# Patient Record
Sex: Male | Born: 1970 | Race: White | Hispanic: No | Marital: Single | Smoking: Never smoker
Health system: Southern US, Community
[De-identification: ages and names within clinical notes are randomized; demographics above are authoritative.]

## PROBLEM LIST (undated history)

## (undated) DIAGNOSIS — I10 Essential (primary) hypertension: Secondary | ICD-10-CM

## (undated) DIAGNOSIS — M1990 Unspecified osteoarthritis, unspecified site: Secondary | ICD-10-CM

## (undated) DIAGNOSIS — E119 Type 2 diabetes mellitus without complications: Secondary | ICD-10-CM

## (undated) DIAGNOSIS — M519 Unspecified thoracic, thoracolumbar and lumbosacral intervertebral disc disorder: Secondary | ICD-10-CM

## (undated) DIAGNOSIS — E785 Hyperlipidemia, unspecified: Secondary | ICD-10-CM

## (undated) HISTORY — DX: Type 2 diabetes mellitus without complications: E11.9

## (undated) HISTORY — PX: HERNIA REPAIR: SHX51

## (undated) HISTORY — DX: Unspecified thoracic, thoracolumbar and lumbosacral intervertebral disc disorder: M51.9

## (undated) HISTORY — DX: Essential (primary) hypertension: I10

## (undated) HISTORY — DX: Hyperlipidemia, unspecified: E78.5

---

## 2000-02-04 ENCOUNTER — Encounter: Admission: RE | Admit: 2000-02-04 | Discharge: 2000-04-07 | Payer: Self-pay | Admitting: Neurosurgery

## 2000-04-30 ENCOUNTER — Encounter: Admission: RE | Admit: 2000-04-30 | Discharge: 2000-07-29 | Payer: Self-pay | Admitting: Neurosurgery

## 2001-01-02 ENCOUNTER — Emergency Department (HOSPITAL_COMMUNITY): Admission: EM | Admit: 2001-01-02 | Discharge: 2001-01-02 | Payer: Self-pay | Admitting: Emergency Medicine

## 2001-09-19 ENCOUNTER — Emergency Department (HOSPITAL_COMMUNITY): Admission: EM | Admit: 2001-09-19 | Discharge: 2001-09-19 | Payer: Self-pay | Admitting: Emergency Medicine

## 2002-02-02 ENCOUNTER — Emergency Department (HOSPITAL_COMMUNITY): Admission: EM | Admit: 2002-02-02 | Discharge: 2002-02-02 | Payer: Self-pay | Admitting: Emergency Medicine

## 2002-02-20 ENCOUNTER — Emergency Department (HOSPITAL_COMMUNITY): Admission: EM | Admit: 2002-02-20 | Discharge: 2002-02-20 | Payer: Self-pay | Admitting: Emergency Medicine

## 2002-03-06 ENCOUNTER — Emergency Department (HOSPITAL_COMMUNITY): Admission: EM | Admit: 2002-03-06 | Discharge: 2002-03-06 | Payer: Self-pay | Admitting: Emergency Medicine

## 2002-04-12 ENCOUNTER — Emergency Department (HOSPITAL_COMMUNITY): Admission: EM | Admit: 2002-04-12 | Discharge: 2002-04-12 | Payer: Self-pay | Admitting: Emergency Medicine

## 2002-07-07 ENCOUNTER — Emergency Department (HOSPITAL_COMMUNITY): Admission: EM | Admit: 2002-07-07 | Discharge: 2002-07-07 | Payer: Self-pay | Admitting: Emergency Medicine

## 2002-07-31 ENCOUNTER — Emergency Department (HOSPITAL_COMMUNITY): Admission: EM | Admit: 2002-07-31 | Discharge: 2002-07-31 | Payer: Self-pay

## 2003-06-30 ENCOUNTER — Emergency Department (HOSPITAL_COMMUNITY): Admission: EM | Admit: 2003-06-30 | Discharge: 2003-06-30 | Payer: Self-pay | Admitting: Emergency Medicine

## 2004-10-14 ENCOUNTER — Emergency Department (HOSPITAL_COMMUNITY): Admission: EM | Admit: 2004-10-14 | Discharge: 2004-10-14 | Payer: Self-pay | Admitting: Emergency Medicine

## 2004-11-15 ENCOUNTER — Emergency Department (HOSPITAL_COMMUNITY): Admission: EM | Admit: 2004-11-15 | Discharge: 2004-11-15 | Payer: Self-pay | Admitting: Emergency Medicine

## 2005-01-19 ENCOUNTER — Emergency Department (HOSPITAL_COMMUNITY): Admission: EM | Admit: 2005-01-19 | Discharge: 2005-01-19 | Payer: Self-pay | Admitting: Family Medicine

## 2005-11-11 ENCOUNTER — Emergency Department (HOSPITAL_COMMUNITY): Admission: EM | Admit: 2005-11-11 | Discharge: 2005-11-11 | Payer: Self-pay | Admitting: Emergency Medicine

## 2008-01-08 ENCOUNTER — Emergency Department (HOSPITAL_COMMUNITY): Admission: EM | Admit: 2008-01-08 | Discharge: 2008-01-08 | Payer: Self-pay | Admitting: Emergency Medicine

## 2008-02-07 ENCOUNTER — Emergency Department (HOSPITAL_COMMUNITY): Admission: EM | Admit: 2008-02-07 | Discharge: 2008-02-07 | Payer: Self-pay | Admitting: *Deleted

## 2008-07-30 ENCOUNTER — Emergency Department (HOSPITAL_COMMUNITY): Admission: EM | Admit: 2008-07-30 | Discharge: 2008-07-30 | Payer: Self-pay | Admitting: Emergency Medicine

## 2009-12-25 ENCOUNTER — Emergency Department (HOSPITAL_BASED_OUTPATIENT_CLINIC_OR_DEPARTMENT_OTHER): Admission: EM | Admit: 2009-12-25 | Discharge: 2009-12-25 | Payer: Self-pay | Admitting: Emergency Medicine

## 2010-08-21 ENCOUNTER — Encounter: Payer: Self-pay | Admitting: *Deleted

## 2010-08-21 DIAGNOSIS — E119 Type 2 diabetes mellitus without complications: Secondary | ICD-10-CM | POA: Insufficient documentation

## 2010-08-21 DIAGNOSIS — E785 Hyperlipidemia, unspecified: Secondary | ICD-10-CM

## 2010-08-21 DIAGNOSIS — I10 Essential (primary) hypertension: Secondary | ICD-10-CM

## 2010-11-07 IMAGING — CR DG SHOULDER 2+V*R*
4 series · 4 of 4 positions shown · non-contrast
Comparison: 10/14/2004

CLINICAL DATA: Pain.  Throwing injury.

RIGHT SHOULDER - 2+ VIEW

[w shoulder ap external right (1 of 2)]
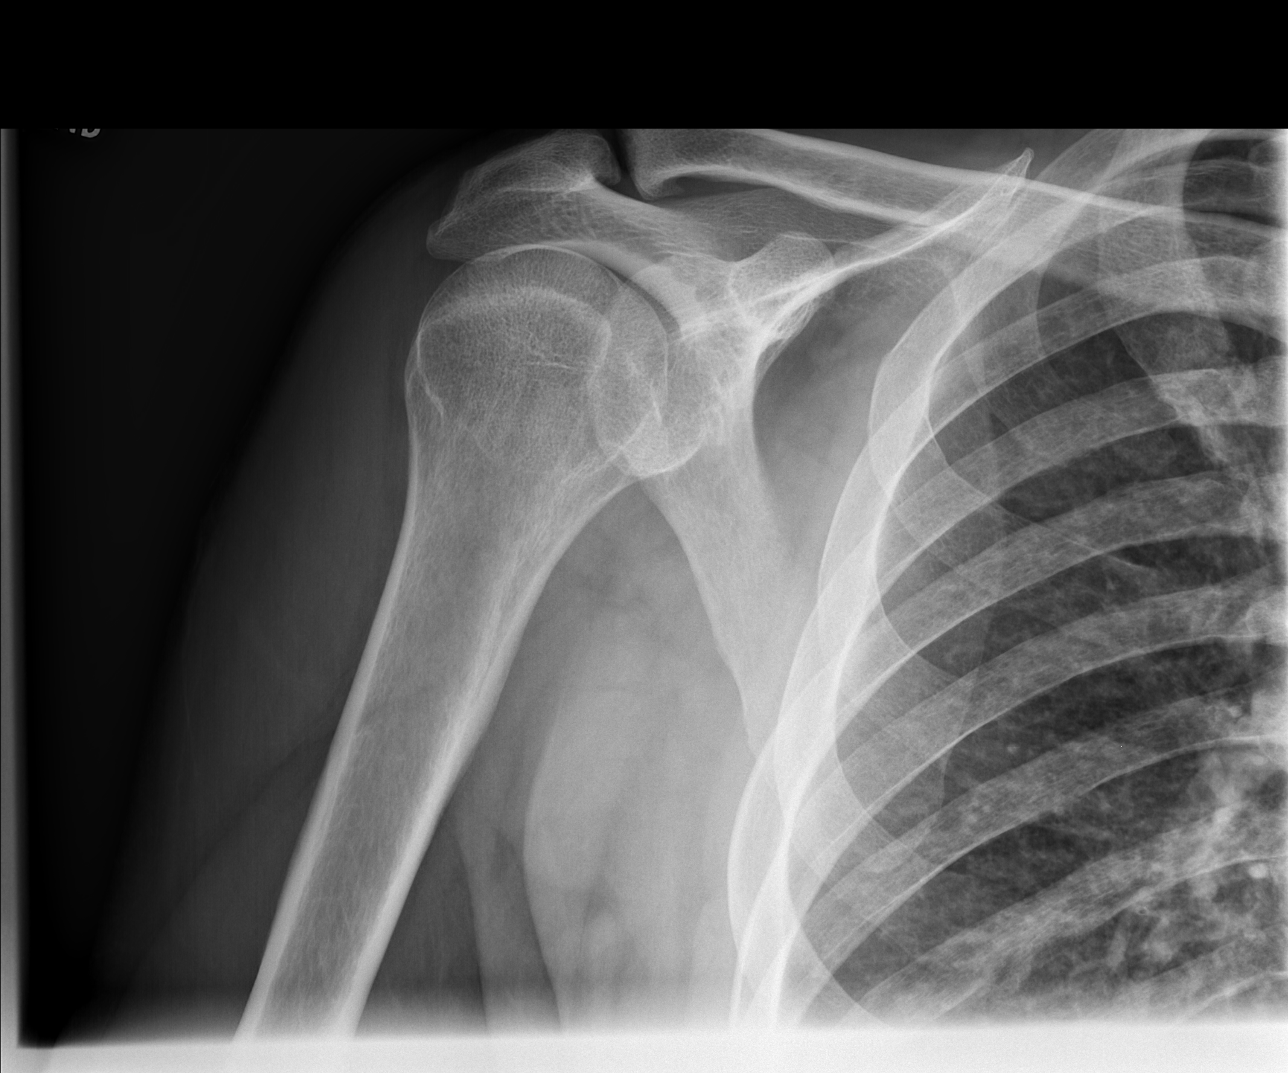

[w shoulder ap internal right]
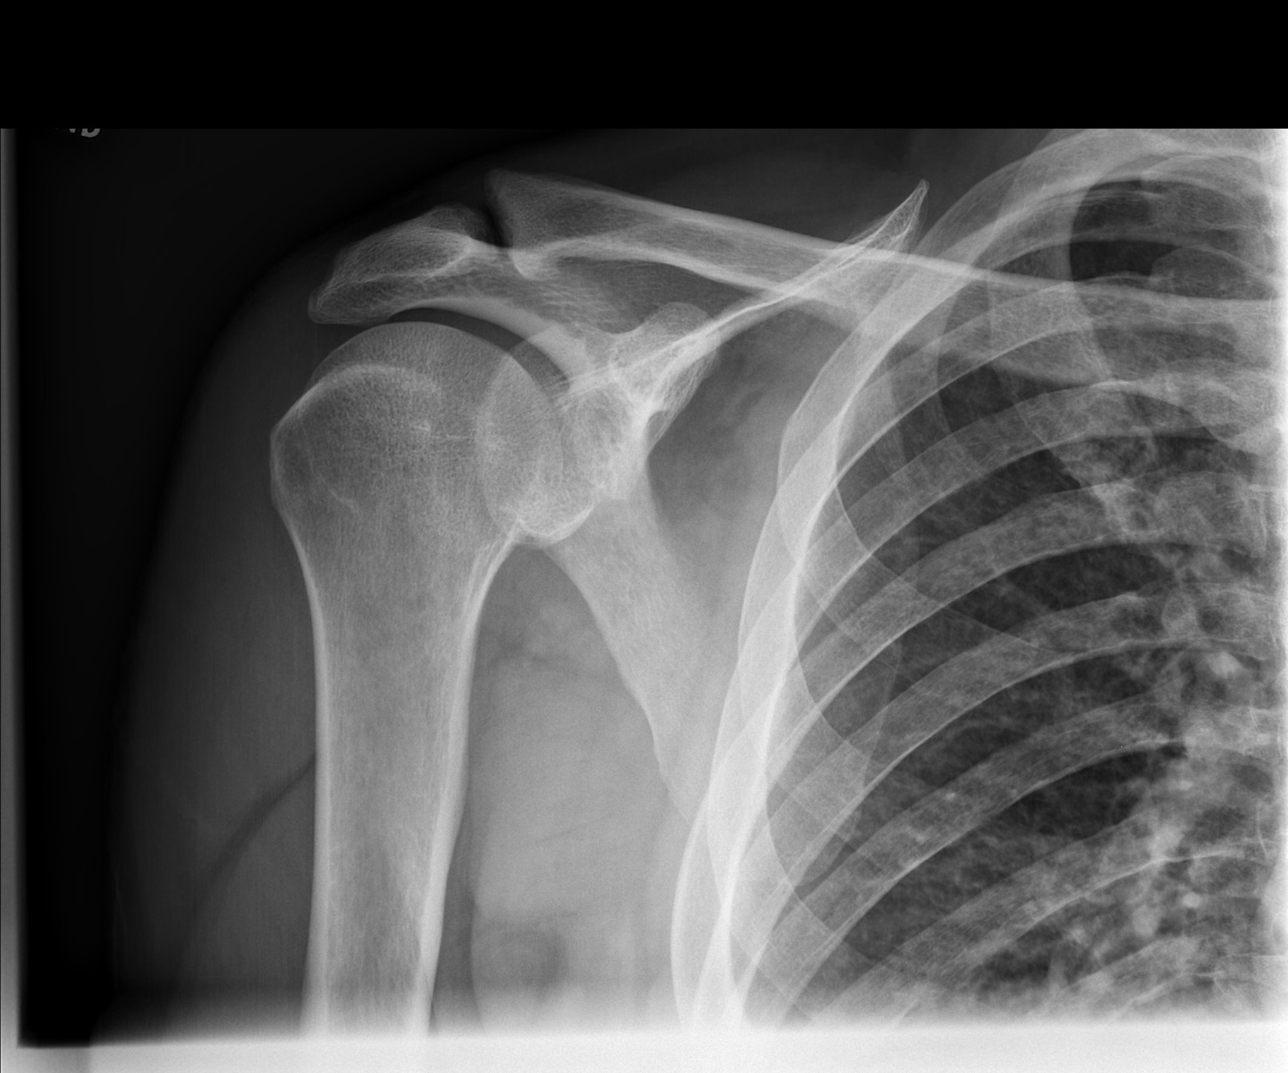

[w shoulder ap external right (2 of 2)]
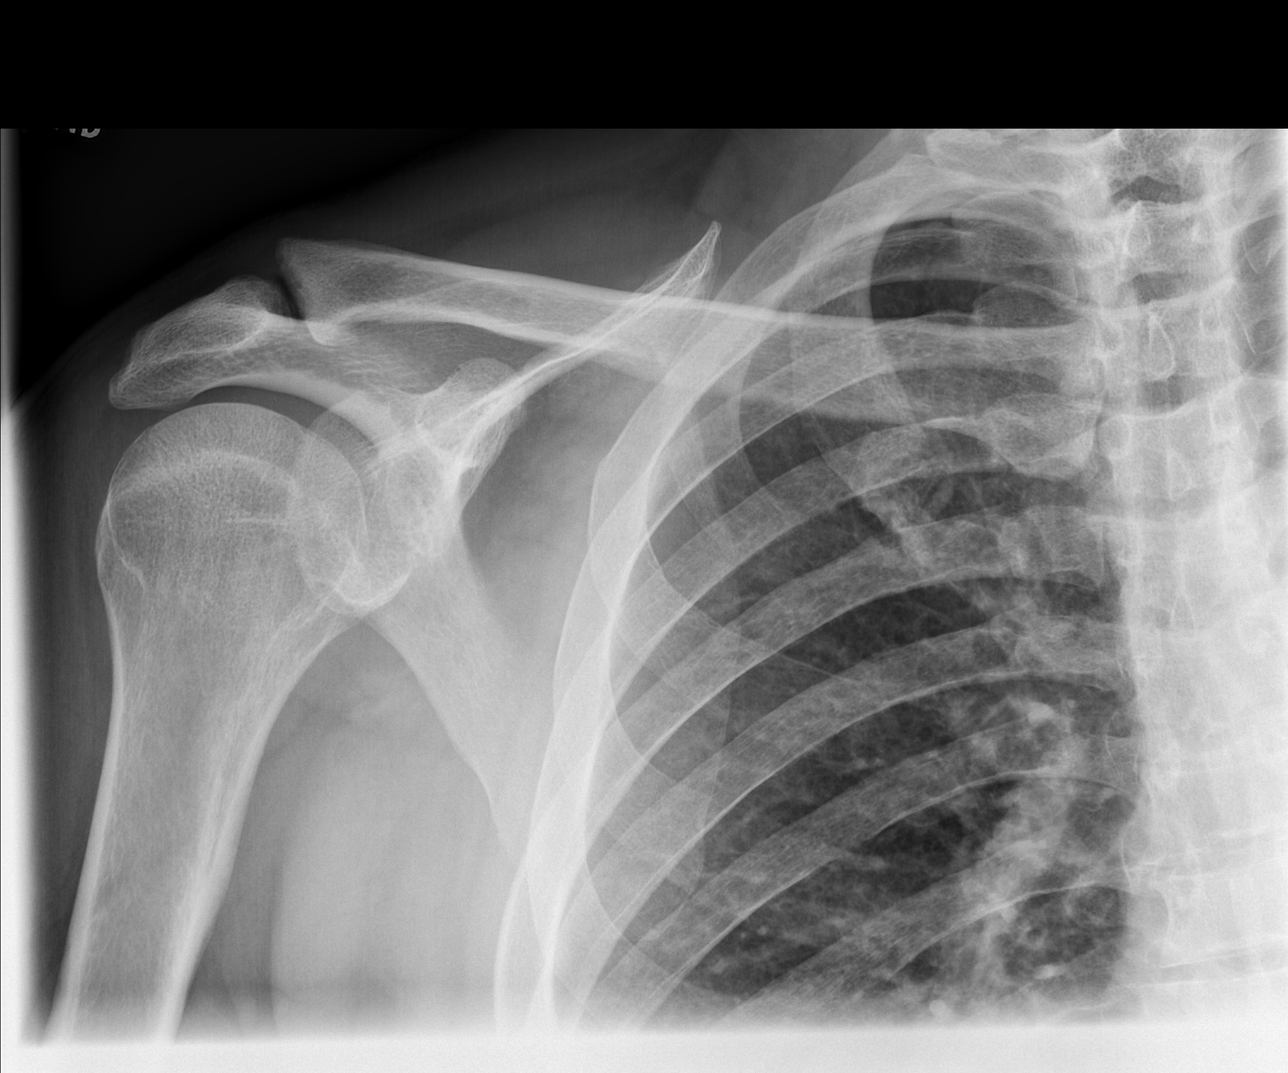

[w shoulder y view right *]
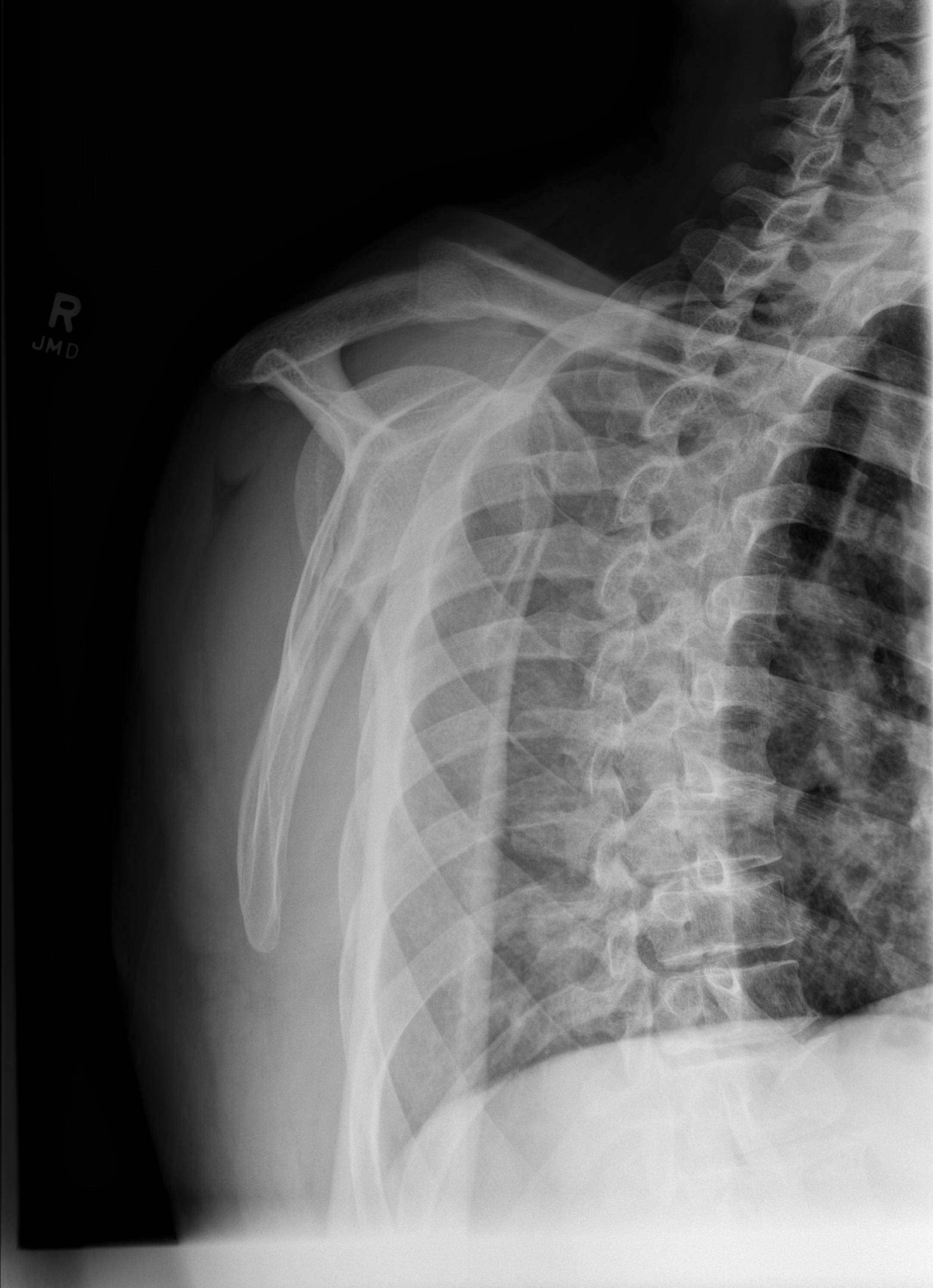

[4 of 4 positions shown; findings below may reference images not displayed]

FINDINGS: The patient achieves good internal and external rotation.
The glenohumeral joint is unremarkable.  There is a normal distance
between the humeral head and the acromial arch.  The acromion is
laterally pointed.  There is AC joint degenerative disease with
inferiorly projecting osteophytes.  The AC joint could be painful
in and of itself and could also contribute to impingement.
IMPRESSION: No glenohumeral joint pathology seen.  Unfavorable acromial
anatomy.  AC joint degenerative change which could be painful and
could contribute to impingement.

## 2012-07-30 ENCOUNTER — Telehealth: Payer: Self-pay | Admitting: Physician Assistant

## 2012-07-31 ENCOUNTER — Ambulatory Visit: Payer: Self-pay | Admitting: Physician Assistant

## 2012-07-31 ENCOUNTER — Telehealth: Payer: Self-pay | Admitting: Physician Assistant

## 2012-07-31 NOTE — Telephone Encounter (Signed)
appt made

## 2012-07-31 NOTE — Telephone Encounter (Signed)
Pt to call back and sch appt

## 2012-08-03 ENCOUNTER — Encounter: Payer: Self-pay | Admitting: Physician Assistant

## 2012-08-03 ENCOUNTER — Ambulatory Visit (INDEPENDENT_AMBULATORY_CARE_PROVIDER_SITE_OTHER): Payer: BC Managed Care – PPO | Admitting: Physician Assistant

## 2012-08-03 VITALS — BP 120/73 | HR 86 | Temp 98.9°F | Ht 70.0 in | Wt 225.6 lb

## 2012-08-03 DIAGNOSIS — E119 Type 2 diabetes mellitus without complications: Secondary | ICD-10-CM

## 2012-08-03 DIAGNOSIS — E785 Hyperlipidemia, unspecified: Secondary | ICD-10-CM

## 2012-08-03 DIAGNOSIS — I10 Essential (primary) hypertension: Secondary | ICD-10-CM

## 2012-08-03 LAB — COMPREHENSIVE METABOLIC PANEL
ALT: 20 U/L (ref 0–53)
AST: 16 U/L (ref 0–37)
Albumin: 4.6 g/dL (ref 3.5–5.2)
Alkaline Phosphatase: 48 U/L (ref 39–117)
Calcium: 9.7 mg/dL (ref 8.4–10.5)
Chloride: 102 mEq/L (ref 96–112)
Potassium: 4 mEq/L (ref 3.5–5.3)

## 2012-08-03 LAB — POCT CBC
Granulocyte percent: 58.5 %G (ref 37–80)
MPV: 6.8 fL (ref 0–99.8)
POC Granulocyte: 4.6 (ref 2–6.9)
Platelet Count, POC: 293 10*3/uL (ref 142–424)
RBC: 4.7 M/uL (ref 4.69–6.13)
RDW, POC: 12.8 %

## 2012-08-03 LAB — POCT GLYCOSYLATED HEMOGLOBIN (HGB A1C): Hemoglobin A1C: 5.8

## 2012-08-03 MED ORDER — ROSUVASTATIN CALCIUM 10 MG PO TABS: 10.0000 mg | ORAL_TABLET | Freq: Every day | ORAL | Status: DC

## 2012-08-03 MED ORDER — AMLODIPINE BESYLATE 10 MG PO TABS: 10.0000 mg | ORAL_TABLET | Freq: Every day | ORAL | Status: DC

## 2012-08-03 MED ORDER — NEBIVOLOL HCL 5 MG PO TABS: 5.0000 mg | ORAL_TABLET | Freq: Every day | ORAL | Status: DC

## 2012-08-03 MED ORDER — METFORMIN HCL 1000 MG PO TABS: 1000.0000 mg | ORAL_TABLET | Freq: Two times a day (BID) | ORAL | Status: DC

## 2012-08-03 MED ORDER — LISINOPRIL 10 MG PO TABS: 10.0000 mg | ORAL_TABLET | Freq: Every day | ORAL | Status: DC

## 2012-08-03 NOTE — Progress Notes (Signed)
  Subjective:    Patient ID: Corey Cruz, male    DOB: 05-Feb-1971, 42 y.o.   MRN: [id number]  HPI Regularly scheduled follow up on diabetes, HTN, hyperlipidemia; recently lost job, lost insurance, in process of planning for what he will need to do for his health care for the next several months   Review of Systems  All other systems reviewed and are negative.       Objective:   Physical Exam  Constitutional: He is oriented to person, place, and time. He appears well-developed and well-nourished.  HENT:  Head: Normocephalic and atraumatic.  Right Ear: External ear normal.  Left Ear: External ear normal.  Nose: Nose normal.  Mouth/Throat: Oropharynx is clear and moist.  Eyes: Conjunctivae and EOM are normal. Pupils are equal, round, and reactive to light.  Neck: Normal range of motion. Neck supple.  Cardiovascular: Normal rate, regular rhythm and normal heart sounds.   Pulmonary/Chest: Effort normal and breath sounds normal.  Abdominal: Soft. Bowel sounds are normal.  Musculoskeletal: Normal range of motion.  Neurological: He is alert and oriented to person, place, and time.  Skin: Skin is warm and dry.  Psychiatric: He has a normal mood and affect. His behavior is normal. Judgment and thought content normal.          Assessment & Plan:  HTN (hypertension) - Plan: amLODipine (NORVASC) 10 MG tablet, lisinopril (PRINIVIL,ZESTRIL) 10 MG tablet, nebivolol (BYSTOLIC) 5 MG tablet, POCT CBC, Comprehensive metabolic panel  Diabetes - Plan: metFORMIN (GLUCOPHAGE) 1000 MG tablet, Microalbumin, urine, POCT glycosylated hemoglobin (Hb A1C)  Other and unspecified hyperlipidemia - Plan: rosuvastatin (CRESTOR) 10 MG tablet, NMR Lipoprofile with Lipids

## 2012-08-04 LAB — NMR LIPOPROFILE WITH LIPIDS
HDL Particle Number: 28.3 umol/L — ABNORMAL LOW (ref >=30.5)
HDL Size: 8.8 nm — ABNORMAL LOW (ref >=9.2)
Large HDL-P: 3.4 umol/L — ABNORMAL LOW (ref >=4.8)
Large VLDL-P: 3.5 nmol/L — ABNORMAL HIGH (ref <=2.7)
Triglycerides: 291 mg/dL — ABNORMAL HIGH (ref <150)

## 2012-08-10 ENCOUNTER — Telehealth: Payer: Self-pay | Admitting: *Deleted

## 2012-08-10 NOTE — Telephone Encounter (Signed)
Pt aware of results. Will diet, exercise and follow up with [place].

## 2012-08-10 NOTE — Telephone Encounter (Signed)
Message copied by Almeta Monas on Mon Aug 10, 2012  3:10 PM ------      Message from: Horald Pollen      Created: Fri Aug 07, 2012  2:58 PM       Lipids on the high side ------

## 2012-08-19 ENCOUNTER — Telehealth: Payer: Self-pay | Admitting: *Deleted

## 2012-08-19 ENCOUNTER — Other Ambulatory Visit: Payer: Self-pay | Admitting: Family Medicine

## 2012-08-19 DIAGNOSIS — E785 Hyperlipidemia, unspecified: Secondary | ICD-10-CM

## 2012-08-19 MED ORDER — ATORVASTATIN CALCIUM 10 MG PO TABS: 10.0000 mg | ORAL_TABLET | Freq: Every day | ORAL | Status: DC

## 2012-09-22 NOTE — Telephone Encounter (Signed)
Crestor dc atorvastatin ordered per Dr Modesto Charon sent to pharmacy

## 2013-02-10 ENCOUNTER — Other Ambulatory Visit: Payer: Self-pay | Admitting: Family Medicine

## 2013-03-14 ENCOUNTER — Other Ambulatory Visit: Payer: Self-pay | Admitting: Family Medicine

## 2013-03-23 ENCOUNTER — Telehealth: Payer: Self-pay | Admitting: Family Medicine

## 2013-03-23 MED ORDER — ATORVASTATIN CALCIUM 10 MG PO TABS: 10.0000 mg | ORAL_TABLET | Freq: Every day | ORAL | Status: DC

## 2013-03-23 NOTE — Telephone Encounter (Signed)
done

## 2013-04-09 ENCOUNTER — Ambulatory Visit: Payer: BC Managed Care – PPO | Admitting: Family Medicine

## 2013-05-07 ENCOUNTER — Ambulatory Visit: Payer: BC Managed Care – PPO | Admitting: Family Medicine

## 2013-05-14 ENCOUNTER — Ambulatory Visit (INDEPENDENT_AMBULATORY_CARE_PROVIDER_SITE_OTHER): Payer: Managed Care, Other (non HMO) | Admitting: Family Medicine

## 2013-05-14 ENCOUNTER — Encounter: Payer: Self-pay | Admitting: Family Medicine

## 2013-05-14 VITALS — BP 155/82 | HR 70 | Temp 97.7°F | Ht 70.0 in | Wt 224.0 lb

## 2013-05-14 DIAGNOSIS — Z0000 Encounter for general adult medical examination without abnormal findings: Secondary | ICD-10-CM

## 2013-05-14 DIAGNOSIS — M1990 Unspecified osteoarthritis, unspecified site: Secondary | ICD-10-CM

## 2013-05-14 DIAGNOSIS — M129 Arthropathy, unspecified: Secondary | ICD-10-CM

## 2013-05-14 DIAGNOSIS — E119 Type 2 diabetes mellitus without complications: Secondary | ICD-10-CM

## 2013-05-14 DIAGNOSIS — I10 Essential (primary) hypertension: Secondary | ICD-10-CM

## 2013-05-14 DIAGNOSIS — E785 Hyperlipidemia, unspecified: Secondary | ICD-10-CM

## 2013-05-14 DIAGNOSIS — E1159 Type 2 diabetes mellitus with other circulatory complications: Secondary | ICD-10-CM | POA: Insufficient documentation

## 2013-05-14 LAB — POCT CBC
Granulocyte percent: 62.4 %G (ref 37–80)
HCT, POC: 47.4 % (ref 43.5–53.7)
Hemoglobin: 15.8 g/dL (ref 14.1–18.1)
Lymph, poc: 2.9 (ref 0.6–3.4)
MCH, POC: 31.3 pg — AB (ref 27–31.2)
MCHC: 33.3 g/dL (ref 31.8–35.4)
MCV: 94 fL (ref 80–97)
MPV: 6.7 fL (ref 0–99.8)
POC Granulocyte: 5.5 (ref 2–6.9)
POC LYMPH PERCENT: 32.4 %L (ref 10–50)
Platelet Count, POC: 296 10*3/uL (ref 142–424)
RBC: 5 M/uL (ref 4.69–6.13)
RDW, POC: 12.2 %
WBC: 8.8 10*3/uL (ref 4.6–10.2)

## 2013-05-14 LAB — POCT GLYCOSYLATED HEMOGLOBIN (HGB A1C): Hemoglobin A1C: 5.9

## 2013-05-14 MED ORDER — MELOXICAM 15 MG PO TABS: 15.0000 mg | ORAL_TABLET | Freq: Every day | ORAL | Status: DC

## 2013-05-14 MED ORDER — METFORMIN HCL 1000 MG PO TABS: 1000.0000 mg | ORAL_TABLET | Freq: Two times a day (BID) | ORAL | Status: DC

## 2013-05-14 MED ORDER — ATORVASTATIN CALCIUM 10 MG PO TABS: 10.0000 mg | ORAL_TABLET | Freq: Every day | ORAL | Status: DC

## 2013-05-14 MED ORDER — LISINOPRIL 10 MG PO TABS: 10.0000 mg | ORAL_TABLET | Freq: Every day | ORAL | Status: DC

## 2013-05-14 NOTE — Progress Notes (Signed)
   Subjective:    Patient ID: Corey Cruz, male    DOB: [birth year], 43 y.o.   MRN: [id number]  HPI  This 43 y.o. male presents for evaluation of arthralgias and arthritis pain. He has hx of diabetes, hypertension, and hyperlipidemia.  He has not had any Arthritis labs in past. Review of Systems C/o arthritis    No chest pain, SOB, HA, dizziness, vision change, N/V, diarrhea, constipation, dysuria, urinary urgency or frequency, myalgias, arthralgias or rash.  Objective:   Physical Exam  Vital signs noted  Well developed well nourished male.  HEENT - Head atraumatic Normocephalic                Eyes - PERRLA, Conjuctiva - clear Sclera- Clear EOMI                Ears - EAC's Wnl TM's Wnl Gross Hearing WNL                Throat - oropharanx wnl Respiratory - Lungs CTA bilateral Cardiac - RRR S1 and S2 without murmur GI - Abdomen soft Nontender and bowel sounds active x 4 Extremities - No edema. Neuro - Grossly intact.      Assessment & Plan:  Diabetes - Plan: POCT CBC, CMP14+EGFR, POCT glycosylated hemoglobin (Hb A1C), Lipid panel, Thyroid Panel With TSH, PSA, total and free, metFORMIN (GLUCOPHAGE) 1000 MG tablet  HTN (hypertension) - Plan: lisinopril (PRINIVIL,ZESTRIL) 10 MG tablet  Arthritis - Plan: meloxicam (MOBIC) 15 MG tablet  Other and unspecified hyperlipidemia - Plan: atorvastatin (LIPITOR) 10 MG tablet  Routine general medical examination at a health care facility - Plan: meloxicam (MOBIC) 15 MG tablet  Orson Ape Oxford FNP  Lysbeth Penner FNP

## 2013-05-15 LAB — LIPID PANEL
Chol/HDL Ratio: 5.5 ratio units — ABNORMAL HIGH (ref 0.0–5.0)
Cholesterol, Total: 238 mg/dL — ABNORMAL HIGH (ref 100–199)
HDL: 43 mg/dL (ref >39)
LDL Calculated: 164 mg/dL — ABNORMAL HIGH (ref 0–99)
Triglycerides: 155 mg/dL — ABNORMAL HIGH (ref 0–149)
VLDL Cholesterol Cal: 31 mg/dL (ref 5–40)

## 2013-05-15 LAB — CMP14+EGFR
ALT: 25 IU/L (ref 0–44)
AST: 15 IU/L (ref 0–40)
Albumin/Globulin Ratio: 2.1 (ref 1.1–2.5)
Albumin: 4.8 g/dL (ref 3.5–5.5)
Alkaline Phosphatase: 58 IU/L (ref 39–117)
BUN/Creatinine Ratio: 16 (ref 9–20)
BUN: 15 mg/dL (ref 6–24)
CO2: 22 mmol/L (ref 18–29)
Calcium: 9.7 mg/dL (ref 8.7–10.2)
Chloride: 97 mmol/L (ref 97–108)
Creatinine, Ser: 0.92 mg/dL (ref 0.76–1.27)
GFR calc Af Amer: 117 mL/min/{1.73_m2} (ref >59)
GFR calc non Af Amer: 101 mL/min/{1.73_m2} (ref >59)
Globulin, Total: 2.3 g/dL (ref 1.5–4.5)
Glucose: 105 mg/dL — ABNORMAL HIGH (ref 65–99)
Potassium: 4.6 mmol/L (ref 3.5–5.2)
Sodium: 139 mmol/L (ref 134–144)
Total Bilirubin: 0.7 mg/dL (ref 0.0–1.2)
Total Protein: 7.1 g/dL (ref 6.0–8.5)

## 2013-05-15 LAB — PSA, TOTAL AND FREE
PSA, Free Pct: 35 %
PSA, Free: 0.07 ng/mL
PSA: 0.2 ng/mL (ref 0.0–4.0)

## 2013-05-15 LAB — THYROID PANEL WITH TSH
Free Thyroxine Index: 2.1 (ref 1.2–4.9)
T3 Uptake Ratio: 27 % (ref 24–39)
T4, Total: 7.6 ug/dL (ref 4.5–12.0)
TSH: 1.26 u[IU]/mL (ref 0.450–4.500)

## 2013-05-18 ENCOUNTER — Other Ambulatory Visit: Payer: Self-pay | Admitting: Family Medicine

## 2013-05-24 ENCOUNTER — Telehealth: Payer: Self-pay | Admitting: Family Medicine

## 2013-05-24 NOTE — Telephone Encounter (Signed)
Message copied by Waverly Ferrari on Mon May 24, 2013  4:01 PM ------      Message from: Lysbeth Penner      Created: Tue May 18, 2013  8:31 AM       Cholesterol is elevated and make sure taking atorvastatin and recommend adding fish oil tablet otc as directed and repeat FLP in 3-6 months ------

## 2013-09-03 ENCOUNTER — Ambulatory Visit: Payer: Managed Care, Other (non HMO) | Admitting: Family Medicine

## 2013-10-08 ENCOUNTER — Encounter: Payer: Self-pay | Admitting: Family Medicine

## 2013-10-08 ENCOUNTER — Ambulatory Visit (INDEPENDENT_AMBULATORY_CARE_PROVIDER_SITE_OTHER): Payer: Managed Care, Other (non HMO) | Admitting: Family Medicine

## 2013-10-08 VITALS — BP 108/68 | HR 56 | Temp 97.9°F | Ht 70.0 in | Wt 223.2 lb

## 2013-10-08 DIAGNOSIS — I10 Essential (primary) hypertension: Secondary | ICD-10-CM

## 2013-10-08 DIAGNOSIS — E119 Type 2 diabetes mellitus without complications: Secondary | ICD-10-CM

## 2013-10-08 DIAGNOSIS — E785 Hyperlipidemia, unspecified: Secondary | ICD-10-CM

## 2013-10-08 LAB — POCT UA - MICROALBUMIN: Microalbumin Ur, POC: NEGATIVE mg/L

## 2013-10-08 LAB — POCT CBC
Granulocyte percent: 65.2 %G (ref 37–80)
HCT, POC: 48.8 % (ref 43.5–53.7)
Hemoglobin: 15.7 g/dL (ref 14.1–18.1)
Lymph, poc: 2.5 (ref 0.6–3.4)
MCH, POC: 30.2 pg (ref 27–31.2)
MCHC: 32.2 g/dL (ref 31.8–35.4)
MCV: 93.8 fL (ref 80–97)
MPV: 7 fL (ref 0–99.8)
POC Granulocyte: 5.3 (ref 2–6.9)
POC LYMPH PERCENT: 31 %L (ref 10–50)
Platelet Count, POC: 319 10*3/uL (ref 142–424)
RBC: 5.2 M/uL (ref 4.69–6.13)
RDW, POC: 12.8 %
WBC: 8.2 10*3/uL (ref 4.6–10.2)

## 2013-10-08 LAB — POCT GLYCOSYLATED HEMOGLOBIN (HGB A1C): Hemoglobin A1C: 6.1

## 2013-10-08 NOTE — Progress Notes (Signed)
   Subjective:    Patient ID: Corey Cruz, male    DOB: 07-Nov-1970, 43 y.o.   MRN: [id number]  HPI This 43 y.o. male presents for evaluation of CPE.  He has hx of hypertension and diabetes. He is due for labs   Review of Systems No chest pain, SOB, HA, dizziness, vision change, N/V, diarrhea, constipation, dysuria, urinary urgency or frequency, myalgias, arthralgias or rash.     Objective:   Physical Exam  Vital signs noted  Well developed well nourished male.  HEENT - Head atraumatic Normocephalic                Eyes - PERRLA, Conjuctiva - clear Sclera- Clear EOMI                Ears - EAC's Wnl TM's Wnl Gross Hearing WNL                Nose - Nares patent                 Throat - oropharanx wnl Respiratory - Lungs CTA bilateral Cardiac - RRR S1 and S2 without murmur GI - Abdomen soft Nontender and bowel sounds active x 4 Extremities - No edema. Neuro - Grossly intact.      Assessment & Plan:  Diabetes mellitus, type 2 - Plan: POCT glycosylated hemoglobin (Hb A1C), POCT UA - Microalbumin  Hypertension - Plan: POCT CBC  Hyperlipemia - Plan: Lipid panel, CMP14+EGFR  Follow up in 3-4 months  Lysbeth Penner FNP

## 2013-10-09 LAB — CMP14+EGFR
ALT: 18 IU/L (ref 0–44)
AST: 16 IU/L (ref 0–40)
Albumin/Globulin Ratio: 1.8 (ref 1.1–2.5)
Albumin: 4.6 g/dL (ref 3.5–5.5)
Alkaline Phosphatase: 53 IU/L (ref 39–117)
BUN/Creatinine Ratio: 28 — ABNORMAL HIGH (ref 9–20)
BUN: 23 mg/dL (ref 6–24)
CO2: 23 mmol/L (ref 18–29)
Calcium: 9.4 mg/dL (ref 8.7–10.2)
Chloride: 102 mmol/L (ref 97–108)
Creatinine, Ser: 0.83 mg/dL (ref 0.76–1.27)
GFR calc Af Amer: 124 mL/min/{1.73_m2} (ref >59)
GFR calc non Af Amer: 107 mL/min/{1.73_m2} (ref >59)
Globulin, Total: 2.6 g/dL (ref 1.5–4.5)
Glucose: 107 mg/dL — ABNORMAL HIGH (ref 65–99)
Potassium: 4.6 mmol/L (ref 3.5–5.2)
Sodium: 138 mmol/L (ref 134–144)
Total Bilirubin: 0.5 mg/dL (ref 0.0–1.2)
Total Protein: 7.2 g/dL (ref 6.0–8.5)

## 2013-10-09 LAB — LIPID PANEL
Chol/HDL Ratio: 4.2 ratio units (ref 0.0–5.0)
Cholesterol, Total: 179 mg/dL (ref 100–199)
HDL: 43 mg/dL (ref >39)
LDL Calculated: 105 mg/dL — ABNORMAL HIGH (ref 0–99)
Triglycerides: 154 mg/dL — ABNORMAL HIGH (ref 0–149)
VLDL Cholesterol Cal: 31 mg/dL (ref 5–40)

## 2014-01-26 ENCOUNTER — Telehealth: Payer: Self-pay | Admitting: Family Medicine

## 2014-01-26 MED ORDER — AZITHROMYCIN 500 MG PO TABS: 1000.0000 mg | ORAL_TABLET | Freq: Once | ORAL | Status: DC

## 2014-01-26 NOTE — Telephone Encounter (Signed)
Girlfriend found out yesterday she was pregnant and also was diagnosed with trich. Wants to know if antibiotic can be called in. Please advise

## 2014-05-11 ENCOUNTER — Other Ambulatory Visit: Payer: Self-pay | Admitting: Family Medicine

## 2014-07-01 ENCOUNTER — Encounter (INDEPENDENT_AMBULATORY_CARE_PROVIDER_SITE_OTHER): Payer: Self-pay

## 2014-07-01 ENCOUNTER — Ambulatory Visit (INDEPENDENT_AMBULATORY_CARE_PROVIDER_SITE_OTHER): Payer: 59 | Admitting: Family

## 2014-07-01 ENCOUNTER — Encounter: Payer: Self-pay | Admitting: Family

## 2014-07-01 VITALS — BP 120/81 | HR 76 | Temp 96.5°F | Ht 70.0 in | Wt 234.6 lb

## 2014-07-01 DIAGNOSIS — Z125 Encounter for screening for malignant neoplasm of prostate: Secondary | ICD-10-CM

## 2014-07-01 DIAGNOSIS — E785 Hyperlipidemia, unspecified: Secondary | ICD-10-CM | POA: Diagnosis not present

## 2014-07-01 DIAGNOSIS — Z1321 Encounter for screening for nutritional disorder: Secondary | ICD-10-CM | POA: Diagnosis not present

## 2014-07-01 DIAGNOSIS — I10 Essential (primary) hypertension: Secondary | ICD-10-CM | POA: Diagnosis not present

## 2014-07-01 DIAGNOSIS — Z0000 Encounter for general adult medical examination without abnormal findings: Secondary | ICD-10-CM

## 2014-07-01 DIAGNOSIS — E119 Type 2 diabetes mellitus without complications: Secondary | ICD-10-CM

## 2014-07-01 LAB — POCT GLYCOSYLATED HEMOGLOBIN (HGB A1C): HEMOGLOBIN A1C: 6.3

## 2014-07-01 MED ORDER — ATORVASTATIN CALCIUM 10 MG PO TABS: 10.0000 mg | ORAL_TABLET | Freq: Every day | ORAL | Status: DC

## 2014-07-01 MED ORDER — MELOXICAM 15 MG PO TABS: 15.0000 mg | ORAL_TABLET | Freq: Every day | ORAL | Status: DC

## 2014-07-01 MED ORDER — LISINOPRIL 10 MG PO TABS: 10.0000 mg | ORAL_TABLET | Freq: Every day | ORAL | Status: DC

## 2014-07-01 MED ORDER — METFORMIN HCL 1000 MG PO TABS: 1000.0000 mg | ORAL_TABLET | Freq: Two times a day (BID) | ORAL | Status: DC

## 2014-07-01 NOTE — Progress Notes (Addendum)
Subjective:    Patient ID: Corey Cruz, male    DOB: 04/30/1971, 44 y.o.   MRN: [id number]  Pt presents to the office for CPE with the following chronic conditions: Diabetes He presents for his follow-up diabetic visit. He has type 2 diabetes mellitus. His disease course has been stable. Pertinent negatives for hypoglycemia include no confusion, headaches or mood changes. Pertinent negatives for diabetes include no blurred vision, no foot paresthesias, no foot ulcerations and no visual change. Pertinent negatives for hypoglycemia complications include no blackouts and no hospitalization. Symptoms are stable. Pertinent negatives for diabetic complications include no CVA, heart disease, nephropathy or peripheral neuropathy. Risk factors for coronary artery disease include diabetes mellitus, dyslipidemia, hypertension, male sex and obesity. Current diabetic treatment includes oral agent (monotherapy). He is compliant with treatment all of the time. He is following a generally unhealthy diet. (Pt does not check BS today ) An ACE inhibitor/angiotensin II receptor blocker is being taken. Eye exam is current.  Hyperlipidemia This is a chronic problem. The current episode started more than 1 year ago. The problem is uncontrolled. Recent lipid tests were reviewed and are high. Exacerbating diseases include diabetes. Pertinent negatives include no leg pain, myalgias or shortness of breath. Current antihyperlipidemic treatment includes statins. The current treatment provides significant improvement of lipids. Risk factors for coronary artery disease include diabetes mellitus, dyslipidemia, hypertension, male sex and a sedentary lifestyle.  Hypertension This is a chronic problem. The current episode started more than 1 year ago. The problem has been resolved since onset. The problem is controlled. Pertinent negatives include no blurred vision, headaches, palpitations, peripheral edema or shortness of breath.  Risk factors for coronary artery disease include diabetes mellitus, dyslipidemia, male gender, obesity and sedentary lifestyle. Past treatments include ACE inhibitors. The current treatment provides moderate improvement. There is no history of kidney disease, CAD/MI, CVA, heart failure or a thyroid problem. There is no history of sleep apnea.      Review of Systems  Constitutional: Negative.   HENT: Negative.   Eyes: Negative for blurred vision.  Respiratory: Negative.  Negative for shortness of breath.   Cardiovascular: Negative.  Negative for palpitations.  Gastrointestinal: Negative.   Endocrine: Negative.   Genitourinary: Negative.   Musculoskeletal: Negative.  Negative for myalgias.  Neurological: Negative.  Negative for headaches.  Hematological: Negative.   Psychiatric/Behavioral: Negative.  Negative for confusion.  All other systems reviewed and are negative.      Objective:   Physical Exam  Constitutional: He is oriented to person, place, and time. He appears well-developed and well-nourished. No distress.  HENT:  Head: Normocephalic.  Right Ear: External ear normal.  Left Ear: External ear normal.  Mouth/Throat: Oropharynx is clear and moist.  Eyes: Pupils are equal, round, and reactive to light. Right eye exhibits no discharge. Left eye exhibits no discharge.  Neck: Normal range of motion. Neck supple. No thyromegaly present.  Cardiovascular: Normal rate, regular rhythm, normal heart sounds and intact distal pulses.   No murmur heard. Pulmonary/Chest: Effort normal and breath sounds normal. No respiratory distress. He has no wheezes.  Abdominal: Soft. Bowel sounds are normal. He exhibits no distension. There is no tenderness.  Musculoskeletal: Normal range of motion. He exhibits no edema or tenderness.  Neurological: He is alert and oriented to person, place, and time. He has normal reflexes. No cranial nerve deficit.  Skin: Skin is warm and dry. No rash noted. No  erythema.  Psychiatric: He has a normal mood and affect.  His behavior is normal. Judgment and thought content normal.  Vitals reviewed.    BP 120/81 mmHg  Pulse 76  Temp(Src) 96.5 F (35.8 C) (Oral)  Ht '5\' 10"'  (1.778 m)  Wt 234 lb 9.6 oz (106.414 kg)  BMI 33.66 kg/m2      Assessment & Plan:  1. Essential hypertension - CMP14+EGFR - lisinopril (PRINIVIL,ZESTRIL) 10 MG tablet; Take 1 tablet (10 mg total) by mouth daily.  Dispense: 90 tablet; Refill: 4  2. Hyperlipidemia - CMP14+EGFR - Lipid panel - atorvastatin (LIPITOR) 10 MG tablet; Take 1 tablet (10 mg total) by mouth at bedtime.  Dispense: 90 tablet; Refill: 4  3. Diabetes mellitus without complication - POCT glycosylated hemoglobin (Hb A1C) - CMP14+EGFR - metFORMIN (GLUCOPHAGE) 1000 MG tablet; Take 1 tablet (1,000 mg total) by mouth 2 (two) times daily with a meal.  Dispense: 180 tablet; Refill: 4  4. Annual physical exam - POCT glycosylated hemoglobin (Hb A1C) - CMP14+EGFR - Vit D  25 hydroxy (rtn osteoporosis monitoring) - PSA, total and free - Lipid panel  5. Prostate cancer screening - PSA, total and free  6. Encounter for vitamin deficiency screening - Vit D  25 hydroxy (rtn osteoporosis monitoring)   Continue all meds Labs pending Health Maintenance reviewed Diet and exercise encouraged RTO 6 months  Evelina Dun, FNP

## 2014-07-01 NOTE — Patient Instructions (Signed)

## 2014-07-02 LAB — PSA, TOTAL AND FREE
PSA FREE PCT: 35 %
PSA FREE: 0.07 ng/mL
PSA: 0.2 ng/mL (ref 0.0–4.0)

## 2014-07-02 LAB — CMP14+EGFR
ALBUMIN: 4.7 g/dL (ref 3.5–5.5)
ALK PHOS: 55 IU/L (ref 39–117)
ALT: 24 IU/L (ref 0–44)
AST: 16 IU/L (ref 0–40)
Albumin/Globulin Ratio: 1.8 (ref 1.1–2.5)
BILIRUBIN TOTAL: 0.5 mg/dL (ref 0.0–1.2)
BUN/Creatinine Ratio: 24 — ABNORMAL HIGH (ref 9–20)
BUN: 22 mg/dL (ref 6–24)
CALCIUM: 9.6 mg/dL (ref 8.7–10.2)
CO2: 22 mmol/L (ref 18–29)
Chloride: 98 mmol/L (ref 97–108)
Creatinine, Ser: 0.9 mg/dL (ref 0.76–1.27)
GFR calc non Af Amer: 103 mL/min/{1.73_m2} (ref >59)
GFR, EST AFRICAN AMERICAN: 119 mL/min/{1.73_m2} (ref >59)
GLUCOSE: 109 mg/dL — AB (ref 65–99)
Globulin, Total: 2.6 g/dL (ref 1.5–4.5)
Potassium: 4.7 mmol/L (ref 3.5–5.2)
Sodium: 136 mmol/L (ref 134–144)
Total Protein: 7.3 g/dL (ref 6.0–8.5)

## 2014-07-02 LAB — LIPID PANEL
CHOLESTEROL TOTAL: 178 mg/dL (ref 100–199)
Chol/HDL Ratio: 4 ratio units (ref 0.0–5.0)
HDL: 45 mg/dL (ref >39)
LDL CALC: 92 mg/dL (ref 0–99)
Triglycerides: 204 mg/dL — ABNORMAL HIGH (ref 0–149)
VLDL Cholesterol Cal: 41 mg/dL — ABNORMAL HIGH (ref 5–40)

## 2014-07-02 LAB — VITAMIN D 25 HYDROXY (VIT D DEFICIENCY, FRACTURES): VIT D 25 HYDROXY: 20.8 ng/mL — AB (ref 30.0–100.0)

## 2014-07-04 ENCOUNTER — Other Ambulatory Visit: Payer: Self-pay | Admitting: Family

## 2014-07-04 DIAGNOSIS — E559 Vitamin D deficiency, unspecified: Secondary | ICD-10-CM

## 2014-07-04 MED ORDER — ATORVASTATIN CALCIUM 20 MG PO TABS: 20.0000 mg | ORAL_TABLET | Freq: Every day | ORAL | Status: DC

## 2014-07-04 MED ORDER — VITAMIN D (ERGOCALCIFEROL) 1.25 MG (50000 UNIT) PO CAPS: 50000.0000 [IU] | ORAL_CAPSULE | ORAL | Status: DC

## 2014-07-16 LAB — HM DIABETES EYE EXAM

## 2014-07-19 ENCOUNTER — Encounter: Payer: Self-pay | Admitting: *Deleted

## 2014-08-17 ENCOUNTER — Telehealth: Payer: Self-pay | Admitting: Family

## 2014-08-17 DIAGNOSIS — E119 Type 2 diabetes mellitus without complications: Secondary | ICD-10-CM

## 2014-08-17 NOTE — Telephone Encounter (Signed)
Called [facility], they will fix him a bottle at no charge, pt aware

## 2014-08-19 ENCOUNTER — Telehealth: Payer: Self-pay | Admitting: Family

## 2014-08-19 DIAGNOSIS — E119 Type 2 diabetes mellitus without complications: Secondary | ICD-10-CM

## 2014-08-19 MED ORDER — METFORMIN HCL 1000 MG PO TABS: 1000.0000 mg | ORAL_TABLET | Freq: Two times a day (BID) | ORAL | Status: DC

## 2014-08-19 NOTE — Telephone Encounter (Signed)
done

## 2014-12-30 ENCOUNTER — Ambulatory Visit: Payer: 59 | Admitting: Family

## 2015-06-06 ENCOUNTER — Telehealth: Payer: Self-pay | Admitting: Family Medicine

## 2015-06-06 NOTE — Telephone Encounter (Signed)
appt made

## 2015-06-08 ENCOUNTER — Ambulatory Visit (INDEPENDENT_AMBULATORY_CARE_PROVIDER_SITE_OTHER): Payer: Managed Care, Other (non HMO) | Admitting: Family

## 2015-06-08 ENCOUNTER — Encounter: Payer: Self-pay | Admitting: Family

## 2015-06-08 VITALS — BP 122/82 | HR 81 | Temp 96.7°F | Ht 70.0 in | Wt 233.0 lb

## 2015-06-08 DIAGNOSIS — E559 Vitamin D deficiency, unspecified: Secondary | ICD-10-CM | POA: Diagnosis not present

## 2015-06-08 DIAGNOSIS — E785 Hyperlipidemia, unspecified: Secondary | ICD-10-CM | POA: Diagnosis not present

## 2015-06-08 DIAGNOSIS — E119 Type 2 diabetes mellitus without complications: Secondary | ICD-10-CM | POA: Diagnosis not present

## 2015-06-08 DIAGNOSIS — I10 Essential (primary) hypertension: Secondary | ICD-10-CM

## 2015-06-08 LAB — POCT GLYCOSYLATED HEMOGLOBIN (HGB A1C): Hemoglobin A1C: 6.8

## 2015-06-08 NOTE — Progress Notes (Signed)
Subjective:    Patient ID: Corey Cruz, male    DOB: Aug 31, 1970, 45 y.o.   MRN: [id number]  Pt presents to the office for chronic follow up: Diabetes He presents for his follow-up diabetic visit. He has type 2 diabetes mellitus. His disease course has been stable. Pertinent negatives for hypoglycemia include no confusion, headaches or mood changes. Pertinent negatives for diabetes include no blurred vision, no foot paresthesias, no foot ulcerations and no visual change. Pertinent negatives for hypoglycemia complications include no blackouts and no hospitalization. Symptoms are stable. Pertinent negatives for diabetic complications include no CVA, heart disease, nephropathy or peripheral neuropathy. Risk factors for coronary artery disease include diabetes mellitus, dyslipidemia, hypertension, male sex and obesity. Current diabetic treatment includes oral agent (monotherapy). He is compliant with treatment all of the time. He is following a generally healthy diet. (Pt does not check BS ) An ACE inhibitor/angiotensin II receptor blocker is being taken. Eye exam is current.  Hyperlipidemia This is a chronic problem. The current episode started more than 1 year ago. The problem is controlled. Recent lipid tests were reviewed and are normal. Exacerbating diseases include diabetes. Pertinent negatives include no leg pain, myalgias or shortness of breath. Current antihyperlipidemic treatment includes statins. The current treatment provides significant improvement of lipids. Risk factors for coronary artery disease include diabetes mellitus, dyslipidemia, hypertension, male sex and a sedentary lifestyle.  Hypertension This is a chronic problem. The current episode started more than 1 year ago. The problem has been resolved since onset. The problem is controlled. Pertinent negatives include no blurred vision, headaches, palpitations, peripheral edema or shortness of breath. Risk factors for coronary artery  disease include diabetes mellitus, dyslipidemia, male gender, obesity and sedentary lifestyle. Past treatments include ACE inhibitors. The current treatment provides moderate improvement. There is no history of kidney disease, CAD/MI, CVA, heart failure or a thyroid problem. There is no history of sleep apnea.      Review of Systems  Constitutional: Negative.   HENT: Negative.   Eyes: Negative for blurred vision.  Respiratory: Negative.  Negative for shortness of breath.   Cardiovascular: Negative.  Negative for palpitations.  Gastrointestinal: Negative.   Endocrine: Negative.   Genitourinary: Negative.   Musculoskeletal: Negative.  Negative for myalgias.  Neurological: Negative.  Negative for headaches.  Hematological: Negative.   Psychiatric/Behavioral: Negative.  Negative for confusion.  All other systems reviewed and are negative.      Objective:   Physical Exam  Constitutional: He is oriented to person, place, and time. He appears well-developed and well-nourished. No distress.  HENT:  Head: Normocephalic.  Right Ear: External ear normal.  Left Ear: External ear normal.  Mouth/Throat: Oropharynx is clear and moist.  Eyes: Pupils are equal, round, and reactive to light. Right eye exhibits no discharge. Left eye exhibits no discharge.  Neck: Normal range of motion. Neck supple. No thyromegaly present.  Cardiovascular: Normal rate, regular rhythm, normal heart sounds and intact distal pulses.   No murmur heard. Pulmonary/Chest: Effort normal and breath sounds normal. No respiratory distress. He has no wheezes.  Abdominal: Soft. Bowel sounds are normal. He exhibits no distension. There is no tenderness.  Musculoskeletal: Normal range of motion. He exhibits no edema or tenderness.  Neurological: He is alert and oriented to person, place, and time. He has normal reflexes. No cranial nerve deficit.  Skin: Skin is warm and dry. No rash noted. No erythema.  Psychiatric: He has a  normal mood and affect. His behavior is normal.  Judgment and thought content normal.  Vitals reviewed.    BP 122/82 mmHg  Pulse 81  Temp(Src) 96.7 F (35.9 C) (Oral)  Ht '5\' 10"'  (1.778 m)  Wt 233 lb (105.688 kg)  BMI 33.43 kg/m2      Assessment & Plan:  1. Essential hypertension - CMP14+EGFR  2. Diabetes mellitus without complication (HCC) - POCT glycosylated hemoglobin (Hb A1C) - CMP14+EGFR - Microalbumin / creatinine urine ratio  3. Vitamin D deficiency - CMP14+EGFR - VITAMIN D 25 Hydroxy (Vit-D Deficiency, Fractures)  4. Hyperlipidemia - CMP14+EGFR - Lipid panel   Continue all meds Labs pending Health Maintenance reviewed Diet and exercise encouraged RTO 6 months  Evelina Dun, FNP

## 2015-06-08 NOTE — Patient Instructions (Signed)

## 2015-06-09 LAB — CMP14+EGFR
A/G RATIO: 1.8 (ref 1.1–2.5)
ALBUMIN: 4.5 g/dL (ref 3.5–5.5)
ALT: 32 IU/L (ref 0–44)
AST: 18 IU/L (ref 0–40)
Alkaline Phosphatase: 64 IU/L (ref 39–117)
BUN/Creatinine Ratio: 22 — ABNORMAL HIGH (ref 9–20)
BUN: 17 mg/dL (ref 6–24)
Bilirubin Total: 0.5 mg/dL (ref 0.0–1.2)
CALCIUM: 10 mg/dL (ref 8.7–10.2)
CO2: 22 mmol/L (ref 18–29)
Chloride: 99 mmol/L (ref 96–106)
Creatinine, Ser: 0.79 mg/dL (ref 0.76–1.27)
GFR calc Af Amer: 124 mL/min/{1.73_m2} (ref >59)
GFR, EST NON AFRICAN AMERICAN: 108 mL/min/{1.73_m2} (ref >59)
GLOBULIN, TOTAL: 2.5 g/dL (ref 1.5–4.5)
Glucose: 102 mg/dL — ABNORMAL HIGH (ref 65–99)
Potassium: 4.3 mmol/L (ref 3.5–5.2)
SODIUM: 135 mmol/L (ref 134–144)
TOTAL PROTEIN: 7 g/dL (ref 6.0–8.5)

## 2015-06-09 LAB — LIPID PANEL
CHOL/HDL RATIO: 3.7 ratio (ref 0.0–5.0)
Cholesterol, Total: 161 mg/dL (ref 100–199)
HDL: 43 mg/dL (ref >39)
LDL Calculated: 76 mg/dL (ref 0–99)
Triglycerides: 208 mg/dL — ABNORMAL HIGH (ref 0–149)
VLDL Cholesterol Cal: 42 mg/dL — ABNORMAL HIGH (ref 5–40)

## 2015-06-09 LAB — MICROALBUMIN / CREATININE URINE RATIO
CREATININE, UR: 89.2 mg/dL
MICROALB/CREAT RATIO: <3.4 mg/g creat (ref 0.0–30.0)

## 2015-06-09 LAB — VITAMIN D 25 HYDROXY (VIT D DEFICIENCY, FRACTURES): VIT D 25 HYDROXY: 24.8 ng/mL — AB (ref 30.0–100.0)

## 2015-07-23 ENCOUNTER — Other Ambulatory Visit: Payer: Self-pay | Admitting: Family

## 2015-09-08 LAB — HM DIABETES EYE EXAM

## 2015-10-09 ENCOUNTER — Other Ambulatory Visit: Payer: Self-pay | Admitting: Family

## 2015-12-08 ENCOUNTER — Ambulatory Visit: Payer: Managed Care, Other (non HMO) | Admitting: Family

## 2015-12-11 ENCOUNTER — Encounter: Payer: Self-pay | Admitting: Family

## 2015-12-11 ENCOUNTER — Ambulatory Visit (INDEPENDENT_AMBULATORY_CARE_PROVIDER_SITE_OTHER): Payer: Managed Care, Other (non HMO) | Admitting: Family

## 2015-12-11 VITALS — BP 120/82 | HR 84 | Temp 97.1°F | Ht 70.0 in | Wt 242.0 lb

## 2015-12-11 DIAGNOSIS — I10 Essential (primary) hypertension: Secondary | ICD-10-CM | POA: Diagnosis not present

## 2015-12-11 DIAGNOSIS — E669 Obesity, unspecified: Secondary | ICD-10-CM | POA: Diagnosis not present

## 2015-12-11 DIAGNOSIS — E559 Vitamin D deficiency, unspecified: Secondary | ICD-10-CM | POA: Diagnosis not present

## 2015-12-11 DIAGNOSIS — E119 Type 2 diabetes mellitus without complications: Secondary | ICD-10-CM

## 2015-12-11 DIAGNOSIS — E663 Overweight: Secondary | ICD-10-CM | POA: Insufficient documentation

## 2015-12-11 DIAGNOSIS — E785 Hyperlipidemia, unspecified: Secondary | ICD-10-CM

## 2015-12-11 DIAGNOSIS — Z125 Encounter for screening for malignant neoplasm of prostate: Secondary | ICD-10-CM

## 2015-12-11 LAB — BAYER DCA HB A1C WAIVED: HB A1C (BAYER DCA - WAIVED): 7.2 % — ABNORMAL HIGH (ref <7.0)

## 2015-12-11 MED ORDER — LISINOPRIL 10 MG PO TABS: 10.0000 mg | ORAL_TABLET | Freq: Every day | ORAL | 1 refills | Status: DC

## 2015-12-11 MED ORDER — METFORMIN HCL 1000 MG PO TABS: 1000.0000 mg | ORAL_TABLET | Freq: Two times a day (BID) | ORAL | 1 refills | Status: DC

## 2015-12-11 MED ORDER — ASPIRIN EC 81 MG PO TBEC: 81.0000 mg | DELAYED_RELEASE_TABLET | Freq: Every day | ORAL | 1 refills | Status: DC

## 2015-12-11 MED ORDER — ATORVASTATIN CALCIUM 20 MG PO TABS: 20.0000 mg | ORAL_TABLET | Freq: Every day | ORAL | 1 refills | Status: DC

## 2015-12-11 NOTE — Progress Notes (Signed)
Subjective:    Patient ID: Corey Cruz, male    DOB: [birth year], 45 y.o.   MRN: [id number]  Pt presents to the office for chronic follow up: Diabetes  He presents for his follow-up diabetic visit. He has type 2 diabetes mellitus. His disease course has been stable. Pertinent negatives for hypoglycemia include no confusion, headaches or mood changes. Pertinent negatives for diabetes include no blurred vision, no foot paresthesias, no foot ulcerations and no visual change. Pertinent negatives for hypoglycemia complications include no blackouts and no hospitalization. Symptoms are stable. Pertinent negatives for diabetic complications include no CVA, heart disease, nephropathy or peripheral neuropathy. Risk factors for coronary artery disease include diabetes mellitus, dyslipidemia, hypertension, male sex and obesity. Current diabetic treatment includes oral agent (monotherapy). He is compliant with treatment all of the time. He is following a generally healthy diet. His breakfast blood glucose range is generally 110-130 mg/dl. (Pt does not check BS regularly  ) An ACE inhibitor/angiotensin II receptor blocker is being taken. Eye exam is current (June 2017).  Hyperlipidemia  This is a chronic problem. The current episode started more than 1 year ago. The problem is controlled. Recent lipid tests were reviewed and are normal. Exacerbating diseases include diabetes and obesity. Pertinent negatives include no leg pain, myalgias or shortness of breath. Current antihyperlipidemic treatment includes statins. The current treatment provides significant improvement of lipids. Risk factors for coronary artery disease include diabetes mellitus, dyslipidemia, hypertension, male sex and a sedentary lifestyle.  Hypertension  This is a chronic problem. The current episode started more than 1 year ago. The problem has been resolved since onset. The problem is controlled. Pertinent negatives include no blurred  vision, headaches, palpitations, peripheral edema or shortness of breath. Risk factors for coronary artery disease include diabetes mellitus, dyslipidemia, male gender, obesity and sedentary lifestyle. Past treatments include ACE inhibitors. The current treatment provides moderate improvement. There is no history of kidney disease, CAD/MI, CVA, heart failure or a thyroid problem. There is no history of sleep apnea.      Review of Systems  Constitutional: Negative.   HENT: Negative.   Eyes: Negative for blurred vision.  Respiratory: Negative.  Negative for shortness of breath.   Cardiovascular: Negative.  Negative for palpitations.  Gastrointestinal: Negative.   Endocrine: Negative.   Genitourinary: Negative.   Musculoskeletal: Negative.  Negative for myalgias.  Neurological: Negative.  Negative for headaches.  Hematological: Negative.   Psychiatric/Behavioral: Negative.  Negative for confusion.  All other systems reviewed and are negative.      Objective:   Physical Exam  Constitutional: He is oriented to person, place, and time. He appears well-developed and well-nourished. No distress.  HENT:  Head: Normocephalic.  Right Ear: External ear normal.  Left Ear: External ear normal.  Nose: Nose normal.  Mouth/Throat: Oropharynx is clear and moist.  Eyes: Pupils are equal, round, and reactive to light. Right eye exhibits no discharge. Left eye exhibits no discharge.  Neck: Normal range of motion. Neck supple. No thyromegaly present.  Cardiovascular: Normal rate, regular rhythm, normal heart sounds and intact distal pulses.   No murmur heard. Pulmonary/Chest: Effort normal and breath sounds normal. No respiratory distress. He has no wheezes.  Abdominal: Soft. Bowel sounds are normal. He exhibits no distension. There is no tenderness.  Musculoskeletal: Normal range of motion. He exhibits no edema or tenderness.  Neurological: He is alert and oriented to person, place, and time. He has  normal reflexes. No cranial nerve deficit.  Skin: Skin  is warm and dry. No rash noted. No erythema.  Psychiatric: He has a normal mood and affect. His behavior is normal. Judgment and thought content normal.  Vitals reviewed.    BP 120/82   Pulse 84   Temp 97.1 F (36.2 C) (Oral)   Ht _0  (1.778 m)   Wt 242 lb (109.8 kg)   BMI 34.72 kg/m       Assessment & Plan:  1. Obesity (BMI 30-39.9) - CMP14+EGFR  2. Essential hypertension - CMP14+EGFR  3. Diabetes mellitus without complication (HCC) - Bayer DCA Hb A1c Waived - CMP14+EGFR  4. Hyperlipidemia - CMP14+EGFR - Lipid panel  5. Vitamin D deficiency - CMP14+EGFR - VITAMIN D 25 Hydroxy (Vit-D Deficiency, Fractures)  6. Prostate cancer screening - CMP14+EGFR - PSA, total and free   Continue all meds Labs pending Health Maintenance reviewed Diet and exercise encouraged RTO 6 months  Evelina Dun, FNP

## 2015-12-11 NOTE — Patient Instructions (Signed)

## 2015-12-12 ENCOUNTER — Other Ambulatory Visit: Payer: Self-pay | Admitting: Family

## 2015-12-12 LAB — CMP14+EGFR
ALBUMIN: 4.8 g/dL (ref 3.5–5.5)
ALK PHOS: 68 IU/L (ref 39–117)
ALT: 45 IU/L — ABNORMAL HIGH (ref 0–44)
AST: 21 IU/L (ref 0–40)
Albumin/Globulin Ratio: 2 (ref 1.2–2.2)
BILIRUBIN TOTAL: 0.4 mg/dL (ref 0.0–1.2)
BUN / CREAT RATIO: 22 — AB (ref 9–20)
BUN: 18 mg/dL (ref 6–24)
CHLORIDE: 101 mmol/L (ref 96–106)
CO2: 24 mmol/L (ref 18–29)
Calcium: 9.7 mg/dL (ref 8.7–10.2)
Creatinine, Ser: 0.81 mg/dL (ref 0.76–1.27)
GFR calc Af Amer: 122 mL/min/{1.73_m2} (ref >59)
GFR calc non Af Amer: 106 mL/min/{1.73_m2} (ref >59)
GLOBULIN, TOTAL: 2.4 g/dL (ref 1.5–4.5)
Glucose: 124 mg/dL — ABNORMAL HIGH (ref 65–99)
POTASSIUM: 4.2 mmol/L (ref 3.5–5.2)
SODIUM: 140 mmol/L (ref 134–144)
Total Protein: 7.2 g/dL (ref 6.0–8.5)

## 2015-12-12 LAB — VITAMIN D 25 HYDROXY (VIT D DEFICIENCY, FRACTURES): Vit D, 25-Hydroxy: 27.9 ng/mL — ABNORMAL LOW (ref 30.0–100.0)

## 2015-12-12 LAB — LIPID PANEL
CHOLESTEROL TOTAL: 169 mg/dL (ref 100–199)
Chol/HDL Ratio: 4.1 ratio units (ref 0.0–5.0)
HDL: 41 mg/dL (ref >39)
LDL CALC: 79 mg/dL (ref 0–99)
TRIGLYCERIDES: 244 mg/dL — AB (ref 0–149)
VLDL Cholesterol Cal: 49 mg/dL — ABNORMAL HIGH (ref 5–40)

## 2015-12-12 LAB — PSA, TOTAL AND FREE
PSA FREE PCT: 35 %
PSA, Free: 0.07 ng/mL
Prostate Specific Ag, Serum: 0.2 ng/mL (ref 0.0–4.0)

## 2015-12-12 MED ORDER — CANAGLIFLOZIN 100 MG PO TABS: 100.0000 mg | ORAL_TABLET | Freq: Every day | ORAL | Status: DC

## 2015-12-12 MED ORDER — VITAMIN D (ERGOCALCIFEROL) 1.25 MG (50000 UNIT) PO CAPS: 50000.0000 [IU] | ORAL_CAPSULE | ORAL | 3 refills | Status: DC

## 2015-12-17 ENCOUNTER — Other Ambulatory Visit: Payer: Self-pay | Admitting: Family

## 2016-01-22 ENCOUNTER — Other Ambulatory Visit: Payer: Self-pay

## 2016-01-22 ENCOUNTER — Other Ambulatory Visit: Payer: Self-pay | Admitting: Family

## 2016-01-22 MED ORDER — SILDENAFIL CITRATE 100 MG PO TABS: 50.0000 mg | ORAL_TABLET | Freq: Every day | ORAL | 11 refills | Status: DC | PRN

## 2016-01-22 NOTE — Telephone Encounter (Signed)
Patient aware.

## 2016-01-22 NOTE — Telephone Encounter (Signed)
Please address  Dont see Viagra on his list of meds

## 2016-01-22 NOTE — Telephone Encounter (Signed)
Prescription sent to pharmacy.

## 2016-04-01 ENCOUNTER — Other Ambulatory Visit: Payer: Self-pay | Admitting: Family

## 2016-07-12 ENCOUNTER — Ambulatory Visit (INDEPENDENT_AMBULATORY_CARE_PROVIDER_SITE_OTHER): Payer: Managed Care, Other (non HMO) | Admitting: Family

## 2016-07-12 ENCOUNTER — Encounter: Payer: Self-pay | Admitting: Family

## 2016-07-12 ENCOUNTER — Encounter (INDEPENDENT_AMBULATORY_CARE_PROVIDER_SITE_OTHER): Payer: Self-pay

## 2016-07-12 ENCOUNTER — Ambulatory Visit: Payer: Managed Care, Other (non HMO) | Admitting: Family

## 2016-07-12 VITALS — BP 118/89 | HR 80 | Temp 97.9°F | Ht 70.0 in | Wt 234.2 lb

## 2016-07-12 DIAGNOSIS — G8929 Other chronic pain: Secondary | ICD-10-CM | POA: Diagnosis not present

## 2016-07-12 DIAGNOSIS — E785 Hyperlipidemia, unspecified: Secondary | ICD-10-CM | POA: Diagnosis not present

## 2016-07-12 DIAGNOSIS — M549 Dorsalgia, unspecified: Secondary | ICD-10-CM

## 2016-07-12 DIAGNOSIS — N529 Male erectile dysfunction, unspecified: Secondary | ICD-10-CM | POA: Diagnosis not present

## 2016-07-12 DIAGNOSIS — M545 Low back pain, unspecified: Secondary | ICD-10-CM

## 2016-07-12 DIAGNOSIS — E559 Vitamin D deficiency, unspecified: Secondary | ICD-10-CM

## 2016-07-12 DIAGNOSIS — E119 Type 2 diabetes mellitus without complications: Secondary | ICD-10-CM

## 2016-07-12 DIAGNOSIS — E669 Obesity, unspecified: Secondary | ICD-10-CM | POA: Diagnosis not present

## 2016-07-12 DIAGNOSIS — I10 Essential (primary) hypertension: Secondary | ICD-10-CM | POA: Diagnosis not present

## 2016-07-12 LAB — BAYER DCA HB A1C WAIVED: HB A1C (BAYER DCA - WAIVED): 7.6 % — ABNORMAL HIGH (ref <7.0)

## 2016-07-12 MED ORDER — ATORVASTATIN CALCIUM 20 MG PO TABS: 20.0000 mg | ORAL_TABLET | Freq: Every day | ORAL | 1 refills | Status: DC

## 2016-07-12 MED ORDER — SILDENAFIL CITRATE 100 MG PO TABS: 50.0000 mg | ORAL_TABLET | Freq: Every day | ORAL | 11 refills | Status: DC | PRN

## 2016-07-12 MED ORDER — METFORMIN HCL 1000 MG PO TABS: 1000.0000 mg | ORAL_TABLET | Freq: Two times a day (BID) | ORAL | 1 refills | Status: DC

## 2016-07-12 MED ORDER — LISINOPRIL 10 MG PO TABS: 10.0000 mg | ORAL_TABLET | Freq: Every day | ORAL | 1 refills | Status: DC

## 2016-07-12 MED ORDER — MELOXICAM 15 MG PO TABS: 15.0000 mg | ORAL_TABLET | Freq: Every day | ORAL | 1 refills | Status: DC

## 2016-07-12 NOTE — Progress Notes (Signed)
   Subjective:    Patient ID: Corey Cruz, male    DOB: [birth year], 46 y.o.   MRN: [id number]  HPI    Review of Systems     Objective:   Physical Exam        Assessment & Plan:

## 2016-07-12 NOTE — Progress Notes (Signed)
Subjective:    Patient ID: Corey Cruz, male    DOB: 08/17/1970, 46 y.o.   MRN: [id number]  Pt presents to the office for chronic follow up: Diabetes  He presents for his follow-up diabetic visit. He has type 2 diabetes mellitus. His disease course has been stable. Pertinent negatives for hypoglycemia include no confusion, headaches or mood changes. Pertinent negatives for diabetes include no blurred vision, no foot paresthesias, no foot ulcerations and no visual change. Pertinent negatives for hypoglycemia complications include no blackouts and no hospitalization. Symptoms are stable. Pertinent negatives for diabetic complications include no CVA, heart disease, nephropathy or peripheral neuropathy. Risk factors for coronary artery disease include diabetes mellitus, dyslipidemia, hypertension, male sex and obesity. Current diabetic treatment includes oral agent (monotherapy). He is compliant with treatment all of the time. He is following a generally healthy diet. His breakfast blood glucose range is generally 110-130 mg/dl. (Pt does not check BS regularly  ) An ACE inhibitor/angiotensin II receptor blocker is being taken. Eye exam is current (June 2017).  Hyperlipidemia  This is a chronic problem. The current episode started more than 1 year ago. The problem is controlled. Recent lipid tests were reviewed and are normal. Exacerbating diseases include diabetes and obesity. Pertinent negatives include no leg pain, myalgias or shortness of breath. Current antihyperlipidemic treatment includes statins. The current treatment provides significant improvement of lipids. Risk factors for coronary artery disease include diabetes mellitus, dyslipidemia, hypertension, male sex and a sedentary lifestyle.  Hypertension  This is a chronic problem. The current episode started more than 1 year ago. The problem has been resolved since onset. The problem is controlled. Pertinent negatives include no blurred  vision, headaches, palpitations, peripheral edema or shortness of breath. Risk factors for coronary artery disease include diabetes mellitus, dyslipidemia, male gender, obesity and sedentary lifestyle. Past treatments include ACE inhibitors. The current treatment provides moderate improvement. There is no history of kidney disease, CAD/MI, CVA or heart failure. There is no history of sleep apnea or a thyroid problem.  Back Pain  This is a chronic problem. The current episode started more than 1 year ago. The problem occurs intermittently. The problem has been waxing and waning since onset. The pain is present in the lumbar spine. The pain is at a severity of 3/10. The pain is moderate. The symptoms are aggravated by lying down. Pertinent negatives include no headaches or leg pain. Risk factors include obesity. He has tried NSAIDs for the symptoms. The treatment provided mild relief.  ED PT takes Viagra as needed. Stable    Review of Systems  Constitutional: Negative.   HENT: Negative.   Eyes: Negative for blurred vision.  Respiratory: Negative.  Negative for shortness of breath.   Cardiovascular: Negative.  Negative for palpitations.  Gastrointestinal: Negative.   Endocrine: Negative.   Genitourinary: Negative.   Musculoskeletal: Positive for back pain. Negative for myalgias.  Neurological: Negative.  Negative for headaches.  Hematological: Negative.   Psychiatric/Behavioral: Negative.  Negative for confusion.  All other systems reviewed and are negative.      Objective:   Physical Exam  Constitutional: He is oriented to person, place, and time. He appears well-developed and well-nourished. No distress.  HENT:  Head: Normocephalic.  Right Ear: External ear normal.  Left Ear: External ear normal.  Nose: Nose normal.  Mouth/Throat: Oropharynx is clear and moist.  Eyes: Pupils are equal, round, and reactive to light. Right eye exhibits no discharge. Left eye exhibits no discharge.  Neck: Normal range of motion. Neck supple. No thyromegaly present.  Cardiovascular: Normal rate, regular rhythm, normal heart sounds and intact distal pulses.   No murmur heard. Pulmonary/Chest: Effort normal and breath sounds normal. No respiratory distress. He has no wheezes.  Abdominal: Soft. Bowel sounds are normal. He exhibits no distension. There is no tenderness.  Musculoskeletal: Normal range of motion. He exhibits no edema or tenderness.  Neurological: He is alert and oriented to person, place, and time. He has normal reflexes. No cranial nerve deficit.  Skin: Skin is warm and dry. No rash noted. No erythema.  Psychiatric: He has a normal mood and affect. His behavior is normal. Judgment and thought content normal.  Vitals reviewed.    BP 118/89   Pulse 80   Temp 97.9 F (36.6 C) (Oral)   Ht 5' 10" (1.778 m)   Wt 234 lb 3.2 oz (106.2 kg)   BMI 33.60 kg/m       Assessment & Plan:  1. Essential hypertension - CMP14+EGFR - lisinopril (PRINIVIL,ZESTRIL) 10 MG tablet; Take 1 tablet (10 mg total) by mouth daily.  Dispense: 90 tablet; Refill: 1  2. Diabetes mellitus without complication (HCC) - CMP14+EGFR - Microalbumin / creatinine urine ratio - Bayer DCA Hb A1c Waived - metFORMIN (GLUCOPHAGE) 1000 MG tablet; Take 1 tablet (1,000 mg total) by mouth 2 (two) times daily with a meal.  Dispense: 180 tablet; Refill: 1  3. Hyperlipidemia, unspecified hyperlipidemia type - CMP14+EGFR - Lipid panel - atorvastatin (LIPITOR) 20 MG tablet; Take 1 tablet (20 mg total) by mouth daily.  Dispense: 90 tablet; Refill: 1  4. Obesity (BMI 30-39.9) - CMP14+EGFR  5. Vitamin D deficiency - CMP14+EGFR  6. Chronic bilateral low back pain without sciatica - meloxicam (MOBIC) 15 MG tablet; Take 1 tablet (15 mg total) by mouth daily.  Dispense: 90 tablet; Refill: 1  7. Erectile dysfunction, unspecified erectile dysfunction type - sildenafil (VIAGRA) 100 MG tablet; Take 0.5-1 tablets  (50-100 mg total) by mouth daily as needed for erectile dysfunction.  Dispense: 5 tablet; Refill: 11   Continue all meds Labs pending Health Maintenance reviewed Diet and exercise encouraged RTO 6 months   Christy Hawks, FNP   

## 2016-07-12 NOTE — Patient Instructions (Signed)
 Health Maintenance, Male A healthy lifestyle and preventive care is important for your health and wellness. Ask your health care provider about what schedule of regular examinations is right for you. What should I know about weight and diet?  Eat a Healthy Diet  Eat plenty of vegetables, fruits, whole grains, low-fat dairy products, and lean protein.  Do not eat a lot of foods high in solid fats, added sugars, or salt. Maintain a Healthy Weight  Regular exercise can help you achieve or maintain a healthy weight. You should:  Do at least 150 minutes of exercise each week. The exercise should increase your heart rate and make you sweat (moderate-intensity exercise).  Do strength-training exercises at least twice a week. Watch Your Levels of Cholesterol and Blood Lipids  Have your blood tested for lipids and cholesterol every 5 years starting at 48 years of age. If you are at high risk for heart disease, you should start having your blood tested when you are 48 years old. You may need to have your cholesterol levels checked more often if:  Your lipid or cholesterol levels are high.  You are older than 48 years of age.  You are at high risk for heart disease. What should I know about cancer screening? Many types of cancers can be detected early and may often be prevented. Lung Cancer  You should be screened every year for lung cancer if:  You are a current smoker who has smoked for at least 30 years.  You are a former smoker who has quit within the past 15 years.  Talk to your health care provider about your screening options, when you should start screening, and how often you should be screened. Colorectal Cancer  Routine colorectal cancer screening usually begins at 48 years of age and should be repeated every 5-10 years until you are 48 years old. You may need to be screened more often if early forms of precancerous polyps or small growths are found. Your health care provider  may recommend screening at an earlier age if you have risk factors for colon cancer.  Your health care provider may recommend using home test kits to check for hidden blood in the stool.  A small camera at the end of a tube can be used to examine your colon (sigmoidoscopy or colonoscopy). This checks for the earliest forms of colorectal cancer. Prostate and Testicular Cancer  Depending on your age and overall health, your health care provider may do certain tests to screen for prostate and testicular cancer.  Talk to your health care provider about any symptoms or concerns you have about testicular or prostate cancer. Skin Cancer  Check your skin from head to toe regularly.  Tell your health care provider about any new moles or changes in moles, especially if:  There is a change in a mole's size, shape, or color.  You have a mole that is larger than a pencil eraser.  Always use sunscreen. Apply sunscreen liberally and repeat throughout the day.  Protect yourself by wearing long sleeves, pants, a wide-brimmed hat, and sunglasses when outside. What should I know about heart disease, diabetes, and high blood pressure?  If you are 18-39 years of age, have your blood pressure checked every 3-5 years. If you are 40 years of age or older, have your blood pressure checked every year. You should have your blood pressure measured twice-once when you are at a hospital or clinic, and once when you are not at   a hospital or clinic. Record the average of the two measurements. To check your blood pressure when you are not at a hospital or clinic, you can use:  An automated blood pressure machine at a pharmacy.  A home blood pressure monitor.  Talk to your health care provider about your target blood pressure.  If you are between 45-79 years old, ask your health care provider if you should take aspirin to prevent heart disease.  Have regular diabetes screenings by checking your fasting blood sugar  level.  If you are at a normal weight and have a low risk for diabetes, have this test once every three years after the age of 45.  If you are overweight and have a high risk for diabetes, consider being tested at a younger age or more often.  A one-time screening for abdominal aortic aneurysm (AAA) by ultrasound is recommended for men aged 65-75 years who are current or former smokers. What should I know about preventing infection? Hepatitis B  If you have a higher risk for hepatitis B, you should be screened for this virus. Talk with your health care provider to find out if you are at risk for hepatitis B infection. Hepatitis C  Blood testing is recommended for:  Everyone born from 1945 through 1965.  Anyone with known risk factors for hepatitis C. Sexually Transmitted Diseases (STDs)  You should be screened each year for STDs including gonorrhea and chlamydia if:  You are sexually active and are younger than 48 years of age.  You are older than 48 years of age and your health care provider tells you that you are at risk for this type of infection.  Your sexual activity has changed since you were last screened and you are at an increased risk for chlamydia or gonorrhea. Ask your health care provider if you are at risk.  Talk with your health care provider about whether you are at high risk of being infected with HIV. Your health care provider may recommend a prescription medicine to help prevent HIV infection. What else can I do?  Schedule regular health, dental, and eye exams.  Stay current with your vaccines (immunizations).  Do not use any tobacco products, such as cigarettes, chewing tobacco, and e-cigarettes. If you need help quitting, ask your health care provider.  Limit alcohol intake to no more than 2 drinks per day. One drink equals 12 ounces of beer, 5 ounces of wine, or 1 ounces of hard liquor.  Do not use street drugs.  Do not share needles.  Ask your health  care provider for help if you need support or information about quitting drugs.  Tell your health care provider if you often feel depressed.  Tell your health care provider if you have ever been abused or do not feel safe at home. This information is not intended to replace advice given to you by your health care provider. Make sure you discuss any questions you have with your health care provider. Document Released: 10/19/2007 Document Revised: 12/20/2015 Document Reviewed: 01/24/2015 Elsevier Interactive Patient Education  2017 [employer]  

## 2016-07-13 LAB — MICROALBUMIN / CREATININE URINE RATIO
CREATININE, UR: 123.1 mg/dL
Microalbumin, Urine: <3 ug/mL

## 2016-07-13 LAB — CMP14+EGFR
A/G RATIO: 1.8 (ref 1.2–2.2)
ALT: 40 IU/L (ref 0–44)
AST: 21 IU/L (ref 0–40)
Albumin: 4.3 g/dL (ref 3.5–5.5)
Alkaline Phosphatase: 59 IU/L (ref 39–117)
BUN/Creatinine Ratio: 24 — ABNORMAL HIGH (ref 9–20)
BUN: 21 mg/dL (ref 6–24)
Bilirubin Total: 0.5 mg/dL (ref 0.0–1.2)
CALCIUM: 9 mg/dL (ref 8.7–10.2)
CO2: 24 mmol/L (ref 18–29)
Chloride: 99 mmol/L (ref 96–106)
Creatinine, Ser: 0.88 mg/dL (ref 0.76–1.27)
GFR, EST AFRICAN AMERICAN: 118 mL/min/{1.73_m2} (ref >59)
GFR, EST NON AFRICAN AMERICAN: 102 mL/min/{1.73_m2} (ref >59)
GLOBULIN, TOTAL: 2.4 g/dL (ref 1.5–4.5)
Glucose: 107 mg/dL — ABNORMAL HIGH (ref 65–99)
POTASSIUM: 4.4 mmol/L (ref 3.5–5.2)
SODIUM: 139 mmol/L (ref 134–144)
TOTAL PROTEIN: 6.7 g/dL (ref 6.0–8.5)

## 2016-07-13 LAB — LIPID PANEL
CHOL/HDL RATIO: 3.8 ratio (ref 0.0–5.0)
Cholesterol, Total: 155 mg/dL (ref 100–199)
HDL: 41 mg/dL (ref >39)
LDL Calculated: 91 mg/dL (ref 0–99)
Triglycerides: 113 mg/dL (ref 0–149)
VLDL Cholesterol Cal: 23 mg/dL (ref 5–40)

## 2016-07-16 ENCOUNTER — Other Ambulatory Visit: Payer: Self-pay | Admitting: Family

## 2016-07-16 MED ORDER — EMPAGLIFLOZIN 10 MG PO TABS: 10.0000 mg | ORAL_TABLET | Freq: Every day | ORAL | 1 refills | Status: DC

## 2016-10-21 ENCOUNTER — Telehealth: Payer: Self-pay | Admitting: Family

## 2016-11-07 ENCOUNTER — Ambulatory Visit (INDEPENDENT_AMBULATORY_CARE_PROVIDER_SITE_OTHER): Payer: Managed Care, Other (non HMO) | Admitting: Family

## 2016-11-07 ENCOUNTER — Encounter: Payer: Self-pay | Admitting: Family

## 2016-11-07 VITALS — BP 123/73 | HR 75 | Temp 97.9°F | Ht 70.0 in | Wt 207.8 lb

## 2016-11-07 DIAGNOSIS — G8929 Other chronic pain: Secondary | ICD-10-CM | POA: Diagnosis not present

## 2016-11-07 DIAGNOSIS — E119 Type 2 diabetes mellitus without complications: Secondary | ICD-10-CM

## 2016-11-07 DIAGNOSIS — E559 Vitamin D deficiency, unspecified: Secondary | ICD-10-CM | POA: Diagnosis not present

## 2016-11-07 DIAGNOSIS — I10 Essential (primary) hypertension: Secondary | ICD-10-CM

## 2016-11-07 DIAGNOSIS — N529 Male erectile dysfunction, unspecified: Secondary | ICD-10-CM | POA: Diagnosis not present

## 2016-11-07 DIAGNOSIS — E663 Overweight: Secondary | ICD-10-CM

## 2016-11-07 DIAGNOSIS — E785 Hyperlipidemia, unspecified: Secondary | ICD-10-CM | POA: Diagnosis not present

## 2016-11-07 DIAGNOSIS — M545 Low back pain: Secondary | ICD-10-CM

## 2016-11-07 LAB — BAYER DCA HB A1C WAIVED: HB A1C (BAYER DCA - WAIVED): 6.3 % (ref <7.0)

## 2016-11-07 NOTE — Progress Notes (Signed)
Subjective:    Patient ID: Corey Cruz, male    DOB: [birth year], 46 y.o.   MRN: [id number]  PT presents to the office today for chronic follow up.  Hypertension  This is a chronic problem. The current episode started more than 1 year ago. The problem has been resolved since onset. The problem is controlled. Pertinent negatives include no blurred vision, malaise/fatigue, peripheral edema or shortness of breath. Risk factors for coronary artery disease include dyslipidemia, male gender, obesity and sedentary lifestyle. The current treatment provides moderate improvement. There is no history of CVA.  Diabetes  He presents for his follow-up diabetic visit. He has type 2 diabetes mellitus. There are no hypoglycemic associated symptoms. Pertinent negatives for diabetes include no blurred vision, no foot paresthesias and no visual change. Symptoms are stable. Pertinent negatives for diabetic complications include no CVA, heart disease, nephropathy or peripheral neuropathy. He is compliant with treatment all of the time. His weight is stable. He is following a generally healthy diet. His breakfast blood glucose range is generally 110-130 mg/dl. Eye exam is not current.  Back Pain  This is a chronic problem. The current episode started more than 1 year ago. The problem occurs intermittently. The problem has been waxing and waning since onset. The pain is present in the lumbar spine. The quality of the pain is described as aching. The pain is at a severity of 3/10. The pain is mild. Pertinent negatives include no leg pain or numbness. He has tried bed rest and NSAIDs for the symptoms. The treatment provided moderate relief.  Erectile Dysfunction PT taking Viagra. Stable.    Review of Systems  Constitutional: Negative for malaise/fatigue.  Eyes: Negative for blurred vision.  Respiratory: Negative for shortness of breath.   Musculoskeletal: Positive for back pain.  Neurological: Negative for  numbness.  All other systems reviewed and are negative.      Objective:   Physical Exam  Constitutional: He is oriented to person, place, and time. He appears well-developed and well-nourished. No distress.  HENT:  Head: Normocephalic.  Right Ear: External ear normal.  Left Ear: External ear normal.  Nose: Nose normal.  Mouth/Throat: Oropharynx is clear and moist.  Eyes: Pupils are equal, round, and reactive to light. Right eye exhibits no discharge. Left eye exhibits no discharge.  Neck: Normal range of motion. Neck supple. No thyromegaly present.  Cardiovascular: Normal rate, regular rhythm, normal heart sounds and intact distal pulses.   No murmur heard. Pulmonary/Chest: Effort normal and breath sounds normal. No respiratory distress. He has no wheezes.  Abdominal: Soft. Bowel sounds are normal. He exhibits no distension. There is no tenderness.  Musculoskeletal: Normal range of motion. He exhibits no edema or tenderness.  Neurological: He is alert and oriented to person, place, and time. He has normal reflexes. No cranial nerve deficit.  Skin: Skin is warm and dry. No rash noted. No erythema.  Psychiatric: He has a normal mood and affect. His behavior is normal. Judgment and thought content normal.  Vitals reviewed.     BP 123/73   Pulse 75   Temp 97.9 F (36.6 C) (Oral)   Ht '5\' 10"'  (1.778 m)   Wt 207 lb 12.8 oz (94.3 kg)   BMI 29.82 kg/m      Assessment & Plan:  1. Essential hypertension - CMP14+EGFR  2. Diabetes mellitus without complication (HCC) - STM19+QQIW - Lipid panel - Bayer DCA Hb A1c Waived  3. Vitamin D deficienc - CMP14+EGFR  4.  Hyperlipidemia, unspecified hyperlipidemia typ - CMP14+EGFR - Lipid panel  5. Chronic bilateral low back pain without sciatica - CMP14+EGFR  6. Overweight (BMI 25.0-29.9 - CMP14+EGFR  7. Erectile dysfunction, unspecified erectile dysfunction type  - CMP14+EGFR   Continue all meds Labs pending Health  Maintenance reviewed Diet and exercise encouraged RTO 6 months   Evelina Dun, FNP

## 2016-11-07 NOTE — Patient Instructions (Signed)
Diabetes Mellitus and Food It is important for you to manage your blood sugar (glucose) level. Your blood glucose level can be greatly affected by what you eat. Eating healthier foods in the appropriate amounts throughout the day at about the same time each day will help you control your blood glucose level. It can also help slow or prevent worsening of your diabetes mellitus. Healthy eating may even help you improve the level of your blood pressure and reach or maintain a healthy weight. General recommendations for healthful eating and cooking habits include:  Eating meals and snacks regularly. Avoid going long periods of time without eating to lose weight.  Eating a diet that consists mainly of plant-based foods, such as fruits, vegetables, nuts, legumes, and whole grains.  Using low-heat cooking methods, such as baking, instead of high-heat cooking methods, such as deep frying.  Work with your dietitian to make sure you understand how to use the Nutrition Facts information on food labels. How can food affect me? Carbohydrates Carbohydrates affect your blood glucose level more than any other type of food. Your dietitian will help you determine how many carbohydrates to eat at each meal and teach you how to count carbohydrates. Counting carbohydrates is important to keep your blood glucose at a healthy level, especially if you are using insulin or taking certain medicines for diabetes mellitus. Alcohol Alcohol can cause sudden decreases in blood glucose (hypoglycemia), especially if you use insulin or take certain medicines for diabetes mellitus. Hypoglycemia can be a life-threatening condition. Symptoms of hypoglycemia (sleepiness, dizziness, and disorientation) are similar to symptoms of having too much alcohol. If your health care provider has given you approval to drink alcohol, do so in moderation and use the following guidelines:  Women should not have more than one drink per day, and men  should not have more than two drinks per day. One drink is equal to: ? 12 oz of beer. ? 5 oz of wine. ? 1 oz of hard liquor.  Do not drink on an empty stomach.  Keep yourself hydrated. Have water, diet soda, or unsweetened iced tea.  Regular soda, juice, and other mixers might contain a lot of carbohydrates and should be counted.  What foods are not recommended? As you make food choices, it is important to remember that all foods are not the same. Some foods have fewer nutrients per serving than other foods, even though they might have the same number of calories or carbohydrates. It is difficult to get your body what it needs when you eat foods with fewer nutrients. Examples of foods that you should avoid that are high in calories and carbohydrates but low in nutrients include:  Trans fats (most processed foods list trans fats on the Nutrition Facts label).  Regular soda.  Juice.  Candy.  Sweets, such as cake, pie, doughnuts, and cookies.  Fried foods.  What foods can I eat? Eat nutrient-rich foods, which will nourish your body and keep you healthy. The food you should eat also will depend on several factors, including:  The calories you need.  The medicines you take.  Your weight.  Your blood glucose level.  Your blood pressure level.  Your cholesterol level.  You should eat a variety of foods, including:  Protein. ? Lean cuts of meat. ? Proteins low in saturated fats, such as fish, egg whites, and beans. Avoid processed meats.  Fruits and vegetables. ? Fruits and vegetables that may help control blood glucose levels, such as apples,   mangoes, and yams.  Dairy products. ? Choose fat-free or low-fat dairy products, such as milk, yogurt, and cheese.  Grains, bread, pasta, and rice. ? Choose whole grain products, such as multigrain bread, whole oats, and brown rice. These foods may help control blood pressure.  Fats. ? Foods containing healthful fats, such as  nuts, avocado, olive oil, canola oil, and fish.  Does everyone with diabetes mellitus have the same meal plan? Because every person with diabetes mellitus is different, there is not one meal plan that works for everyone. It is very important that you meet with a dietitian who will help you create a meal plan that is just right for you. This information is not intended to replace advice given to you by your health care provider. Make sure you discuss any questions you have with your health care provider. Document Released: 01/17/2005 Document Revised: 09/28/2015 Document Reviewed: 03/19/2013 Elsevier Interactive Patient Education  2017 [employer]  

## 2016-11-08 LAB — CMP14+EGFR
ALT: 16 IU/L (ref 0–44)
AST: 14 IU/L (ref 0–40)
Albumin/Globulin Ratio: 1.8 (ref 1.2–2.2)
Albumin: 4.3 g/dL (ref 3.5–5.5)
Alkaline Phosphatase: 68 IU/L (ref 39–117)
BILIRUBIN TOTAL: 0.4 mg/dL (ref 0.0–1.2)
BUN/Creatinine Ratio: 23 — ABNORMAL HIGH (ref 9–20)
BUN: 19 mg/dL (ref 6–24)
CALCIUM: 9.8 mg/dL (ref 8.7–10.2)
CO2: 17 mmol/L — ABNORMAL LOW (ref 20–29)
Chloride: 102 mmol/L (ref 96–106)
Creatinine, Ser: 0.81 mg/dL (ref 0.76–1.27)
GFR, EST AFRICAN AMERICAN: 122 mL/min/{1.73_m2} (ref >59)
GFR, EST NON AFRICAN AMERICAN: 106 mL/min/{1.73_m2} (ref >59)
GLOBULIN, TOTAL: 2.4 g/dL (ref 1.5–4.5)
Glucose: 100 mg/dL — ABNORMAL HIGH (ref 65–99)
POTASSIUM: 4 mmol/L (ref 3.5–5.2)
SODIUM: 137 mmol/L (ref 134–144)
Total Protein: 6.7 g/dL (ref 6.0–8.5)

## 2016-11-08 LAB — LIPID PANEL
CHOL/HDL RATIO: 3.9 ratio (ref 0.0–5.0)
Cholesterol, Total: 149 mg/dL (ref 100–199)
HDL: 38 mg/dL — ABNORMAL LOW (ref >39)
LDL Calculated: 48 mg/dL (ref 0–99)
Triglycerides: 317 mg/dL — ABNORMAL HIGH (ref 0–149)
VLDL Cholesterol Cal: 63 mg/dL — ABNORMAL HIGH (ref 5–40)

## 2017-01-11 LAB — HM DIABETES EYE EXAM

## 2017-01-26 ENCOUNTER — Other Ambulatory Visit: Payer: Self-pay | Admitting: Family

## 2017-01-26 DIAGNOSIS — M545 Low back pain: Principal | ICD-10-CM

## 2017-01-26 DIAGNOSIS — I10 Essential (primary) hypertension: Secondary | ICD-10-CM

## 2017-01-26 DIAGNOSIS — E119 Type 2 diabetes mellitus without complications: Secondary | ICD-10-CM

## 2017-01-26 DIAGNOSIS — E785 Hyperlipidemia, unspecified: Secondary | ICD-10-CM

## 2017-01-26 DIAGNOSIS — G8929 Other chronic pain: Secondary | ICD-10-CM

## 2017-01-27 NOTE — Telephone Encounter (Signed)
Last seen 11/07/16  Christy   Last Vit D 12/21/15  27.9

## 2017-01-27 NOTE — Telephone Encounter (Signed)
Last seen 11/07/16  Corey Cruz   Last Vit D 12/21/15  27.9

## 2017-01-29 ENCOUNTER — Telehealth: Payer: Self-pay | Admitting: Family

## 2017-01-30 ENCOUNTER — Ambulatory Visit (INDEPENDENT_AMBULATORY_CARE_PROVIDER_SITE_OTHER): Payer: Managed Care, Other (non HMO) | Admitting: Family

## 2017-01-30 ENCOUNTER — Encounter: Payer: Self-pay | Admitting: Family

## 2017-01-30 VITALS — BP 120/82 | HR 84 | Temp 98.3°F | Ht 70.0 in | Wt 206.2 lb

## 2017-01-30 DIAGNOSIS — Z0000 Encounter for general adult medical examination without abnormal findings: Secondary | ICD-10-CM | POA: Diagnosis not present

## 2017-01-30 DIAGNOSIS — G8929 Other chronic pain: Secondary | ICD-10-CM

## 2017-01-30 DIAGNOSIS — E785 Hyperlipidemia, unspecified: Secondary | ICD-10-CM

## 2017-01-30 DIAGNOSIS — I10 Essential (primary) hypertension: Secondary | ICD-10-CM

## 2017-01-30 DIAGNOSIS — M545 Low back pain: Secondary | ICD-10-CM

## 2017-01-30 DIAGNOSIS — E663 Overweight: Secondary | ICD-10-CM

## 2017-01-30 DIAGNOSIS — N529 Male erectile dysfunction, unspecified: Secondary | ICD-10-CM

## 2017-01-30 DIAGNOSIS — E119 Type 2 diabetes mellitus without complications: Secondary | ICD-10-CM

## 2017-01-30 DIAGNOSIS — E559 Vitamin D deficiency, unspecified: Secondary | ICD-10-CM

## 2017-01-30 LAB — BAYER DCA HB A1C WAIVED: HB A1C: 6.1 % (ref <7.0)

## 2017-01-30 NOTE — Telephone Encounter (Signed)
Pt scheduled with Christy today at 11:25.

## 2017-01-30 NOTE — Progress Notes (Signed)
Subjective:    Patient ID: Corey Cruz, male    DOB: [birth year], 46 y.o.   MRN: [id number]  PT presents to the office today for CPE.  Diabetes  He presents for his follow-up diabetic visit. He has type 2 diabetes mellitus. His disease course has been stable. There are no hypoglycemic associated symptoms. Pertinent negatives for diabetes include no blurred vision, no foot paresthesias, no foot ulcerations and no visual change. There are no hypoglycemic complications. Symptoms are stable. Pertinent negatives for diabetic complications include no CVA, heart disease, nephropathy or peripheral neuropathy. He is following a generally healthy diet. His breakfast blood glucose range is generally 110-130 mg/dl. Eye exam is current.  Hypertension  This is a chronic problem. The current episode started more than 1 year ago. The problem has been resolved since onset. The problem is controlled. Pertinent negatives include no blurred vision, malaise/fatigue, peripheral edema or shortness of breath. Risk factors for coronary artery disease include dyslipidemia, diabetes mellitus and sedentary lifestyle. The current treatment provides moderate improvement. There is no history of kidney disease, CAD/MI, CVA or heart failure.  Back Pain  This is a chronic problem. The current episode started more than 1 year ago. The problem occurs intermittently. The problem has been waxing and waning since onset. The pain is present in the lumbar spine. The quality of the pain is described as aching. The pain is at a severity of 3/10. The pain is mild. Pertinent negatives include no bladder incontinence, bowel incontinence or leg pain. He has tried bed rest, ice, muscle relaxant and NSAIDs for the symptoms. The treatment provided moderate relief.  Hyperlipidemia  This is a chronic problem. The current episode started more than 1 year ago. The problem is controlled. Recent lipid tests were reviewed and are normal. Pertinent  negatives include no leg pain or shortness of breath. Current antihyperlipidemic treatment includes statins. The current treatment provides moderate improvement of lipids. Risk factors for coronary artery disease include diabetes mellitus, dyslipidemia, hypertension and a sedentary lifestyle.  ED PT taking Viagra as needed. Stable.     Review of Systems  Constitutional: Negative for malaise/fatigue.  Eyes: Negative for blurred vision.  Respiratory: Negative for shortness of breath.   Gastrointestinal: Negative for bowel incontinence.  Genitourinary: Negative for bladder incontinence.  Musculoskeletal: Positive for back pain.  All other systems reviewed and are negative.      Objective:   Physical Exam  Constitutional: He is oriented to person, place, and time. He appears well-developed and well-nourished. No distress.  HENT:  Head: Normocephalic.  Right Ear: External ear normal.  Left Ear: External ear normal.  Nose: Nose normal.  Mouth/Throat: Oropharynx is clear and moist.  Eyes: Pupils are equal, round, and reactive to light. Right eye exhibits no discharge. Left eye exhibits no discharge.  Neck: Normal range of motion. Neck supple. No thyromegaly present.  Cardiovascular: Normal rate, regular rhythm, normal heart sounds and intact distal pulses.   No murmur heard. Pulmonary/Chest: Effort normal and breath sounds normal. No respiratory distress. He has no wheezes.  Abdominal: Soft. Bowel sounds are normal. He exhibits no distension. There is no tenderness.  Musculoskeletal: Normal range of motion. He exhibits no edema or tenderness.  Neurological: He is alert and oriented to person, place, and time.  Skin: Skin is warm and dry. No rash noted. No erythema.  Psychiatric: He has a normal mood and affect. His behavior is normal. Judgment and thought content normal.  Vitals reviewed.   Diabetic  Foot Exam - Simple   Simple Foot Form Diabetic Foot exam was performed with the  following findings:  Yes 01/30/2017 11:44 AM  Visual Inspection No deformities, no ulcerations, no other skin breakdown bilaterally:  Yes Sensation Testing Intact to touch and monofilament testing bilaterally:  Yes Pulse Check Posterior Tibialis and Dorsalis pulse intact bilaterally:  Yes Comments      BP 120/82   Pulse 84   Temp 98.3 F (36.8 C) (Oral)   Ht '5\' 10"'  (1.778 m)   Wt 206 lb 3.2 oz (93.5 kg)   BMI 29.59 kg/m      Assessment & Plan:  1. Annual physical exam - Bayer DCA Hb A1c Waived - CMP14+EGFR - VITAMIN D 25 Hydroxy (Vit-D Deficiency, Fractures) - TSH - Lipid panel - PSA, total and free  2. Essential hypertension - CMP14+EGFR  3. Diabetes mellitus without complication (HCC) - Bayer DCA Hb A1c Waived - CMP14+EGFR  4. Erectile dysfunction, unspecified erectile dysfunction type - CMP14+EGFR  5. Hyperlipidemia, unspecified hyperlipidemia type - CMP14+EGFR - Lipid panel  6. Vitamin D deficiency - CMP14+EGFR - VITAMIN D 25 Hydroxy (Vit-D Deficiency, Fractures)  7. Chronic bilateral low back pain without sciatica - CMP14+EGFR  8. Overweight (BMI 25.0-29.9) - CMP14+EGFR   Continue all meds Labs pending Health Maintenance reviewed Diet and exercise encouraged RTO 4 months   Evelina Dun, FNP

## 2017-01-30 NOTE — Patient Instructions (Signed)

## 2017-01-30 NOTE — Telephone Encounter (Signed)
I do not see an Annual Physical visit. I only see chronic follow up visits. You can check behind me, but if they are not coded annual physical we can sign paper. Pt can make appt and I can work him in if needed since it is for work.

## 2017-01-31 LAB — CMP14+EGFR
A/G RATIO: 1.9 (ref 1.2–2.2)
ALT: 18 IU/L (ref 0–44)
AST: 18 IU/L (ref 0–40)
Albumin: 4.8 g/dL (ref 3.5–5.5)
Alkaline Phosphatase: 63 IU/L (ref 39–117)
BUN / CREAT RATIO: 23 — AB (ref 9–20)
BUN: 20 mg/dL (ref 6–24)
Bilirubin Total: 0.6 mg/dL (ref 0.0–1.2)
CALCIUM: 9.6 mg/dL (ref 8.7–10.2)
CHLORIDE: 99 mmol/L (ref 96–106)
CO2: 22 mmol/L (ref 20–29)
Creatinine, Ser: 0.86 mg/dL (ref 0.76–1.27)
GFR calc Af Amer: 119 mL/min/{1.73_m2} (ref >59)
GFR calc non Af Amer: 103 mL/min/{1.73_m2} (ref >59)
GLOBULIN, TOTAL: 2.5 g/dL (ref 1.5–4.5)
Glucose: 128 mg/dL — ABNORMAL HIGH (ref 65–99)
POTASSIUM: 4.4 mmol/L (ref 3.5–5.2)
SODIUM: 139 mmol/L (ref 134–144)
TOTAL PROTEIN: 7.3 g/dL (ref 6.0–8.5)

## 2017-01-31 LAB — SPECIMEN STATUS

## 2017-01-31 LAB — LIPID PANEL
CHOL/HDL RATIO: 3.2 ratio (ref 0.0–5.0)
Cholesterol, Total: 135 mg/dL (ref 100–199)
HDL: 42 mg/dL (ref >39)
LDL Calculated: 74 mg/dL (ref 0–99)
Triglycerides: 97 mg/dL (ref 0–149)
VLDL Cholesterol Cal: 19 mg/dL (ref 5–40)

## 2017-01-31 LAB — PSA, TOTAL AND FREE
PROSTATE SPECIFIC AG, SERUM: 0.2 ng/mL (ref 0.0–4.0)
PSA FREE PCT: 30 %
PSA FREE: 0.06 ng/mL

## 2017-01-31 LAB — TSH: TSH: 1.23 u[IU]/mL (ref 0.450–4.500)

## 2017-01-31 LAB — VITAMIN D 25 HYDROXY (VIT D DEFICIENCY, FRACTURES): VIT D 25 HYDROXY: 55.3 ng/mL (ref 30.0–100.0)

## 2017-03-12 ENCOUNTER — Ambulatory Visit (INDEPENDENT_AMBULATORY_CARE_PROVIDER_SITE_OTHER): Payer: Managed Care, Other (non HMO) | Admitting: Family Medicine

## 2017-03-12 ENCOUNTER — Encounter: Payer: Self-pay | Admitting: Family Medicine

## 2017-03-12 VITALS — BP 132/83 | HR 73 | Temp 96.5°F | Ht 70.0 in | Wt 212.4 lb

## 2017-03-12 DIAGNOSIS — N509 Disorder of male genital organs, unspecified: Secondary | ICD-10-CM | POA: Diagnosis not present

## 2017-03-12 DIAGNOSIS — R1909 Other intra-abdominal and pelvic swelling, mass and lump: Secondary | ICD-10-CM | POA: Diagnosis not present

## 2017-03-12 DIAGNOSIS — N5089 Other specified disorders of the male genital organs: Secondary | ICD-10-CM
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
## 2017-03-12 NOTE — Patient Instructions (Addendum)
I have a high suspicion that you have an inguinal hernia that is affecting the right testicle.  I have ordered a CT scan of your abdomen and pelvis to further evaluate.  I placed a referral to general surgery for further evaluation and management as well.  If this turns out not to be a hernia, I may instead refer you to urology.  We will make this determination after we get the results back from your CT scan.  For now I do not when she lifting anything heavier than a milk jug.  Avoid straining, coughing.  If you are having constipation, I highly recommend daily use of a stool softener or MiraLAX as per package instructions.   Inguinal Hernia, Adult An inguinal hernia is when fat or the intestines push through the area where the leg meets the lower abdomen (groin) and create a rounded lump (bulge). This condition develops over time. There are three types of inguinal hernias. These types include:  Hernias that can be pushed back into the belly (are reducible).  Hernias that are not reducible (are incarcerated).  Hernias that are not reducible and lose their blood supply (are strangulated). This type of hernia requires emergency surgery.  What are the causes? This condition is caused by having a weak spot in the muscles or tissue. This weakness lets the hernia poke through. This condition can be triggered by:  Suddenly straining the muscles of the lower abdomen.  Lifting heavy objects.  Straining to have a bowel movement. Difficult bowel movements (constipation) can lead to this.  Coughing.  What increases the risk? This condition is more likely to develop in:  Men.  Pregnant women.  People who: ? Are overweight. ? Work in jobs that require long periods of standing or heavy lifting. ? Have had an inguinal hernia before. ? Smoke or have lung disease. These factors can lead to long-lasting (chronic) coughing.  What are the signs or symptoms? Symptoms can depend on the size of the  hernia. Often, a small inguinal hernia has no symptoms. Symptoms of a larger hernia include:  A lump in the groin. This is easier to see when the person is standing. It might not be visible when he or she is lying down.  Pain or burning in the groin. This occurs especially when lifting, straining, or coughing.  A dull ache or a feeling of pressure in the groin.  A lump in the scrotum in men.  Symptoms of a strangulated inguinal hernia can include:  A bulge in the groin that is very painful and tender to the touch.  A bulge that turns red or purple.  Fever, nausea, and vomiting.  The inability to have a bowel movement or to pass gas.  How is this diagnosed? This condition is diagnosed with a medical history and physical exam. Your health care provider may feel your groin area and ask you to cough. How is this treated? Treatment for this condition varies depending on the size of your hernia and whether you have symptoms. If you do not have symptoms, your health care provider may have you watch your hernia carefully and come in for follow-up visits. If your hernia is larger or if you have symptoms, your treatment will include surgery. Follow these instructions at home: Lifestyle  Drink enough fluid to keep your urine clear or pale yellow.  Eat a diet that includes a lot of fiber. Eat plenty of fruits, vegetables, and whole grains. Talk with your health care  provider if you have questions.  Avoid lifting heavy objects.  Avoid standing for long periods of time.  Do not use tobacco products, including cigarettes, chewing tobacco, or e-cigarettes. If you need help quitting, ask your health care provider.  Maintain a healthy weight. General instructions  Do not try to force the hernia back in.  Watch your hernia for any changes in color or size. Let your health care provider know if any changes occur.  Take over-the-counter and prescription medicines only as told by your health  care provider.  Keep all follow-up visits as told by your health care provider. This is important. Contact a health care provider if:  You have a fever.  You have new symptoms.  Your symptoms get worse. Get help right away if:  You have pain in the groin that suddenly gets worse.  A bulge in the groin gets bigger suddenly and does not go down.  You are a man and you have a sudden pain in the scrotum, or the size of your scrotum suddenly changes.  A bulge in the groin area becomes red or purple and is painful to the touch.  You have nausea or vomiting that does not go away.  You feel your heart beating a lot more quickly than normal.  You cannot have a bowel movement or pass gas. This information is not intended to replace advice given to you by your health care provider. Make sure you discuss any questions you have with your health care provider. Document Released: 09/08/2008 Document Revised: 09/28/2015 Document Reviewed: 03/02/2014 Elsevier Interactive Patient Education  2018 [employer].

## 2017-03-13 ENCOUNTER — Telehealth: Payer: Self-pay

## 2017-03-13 NOTE — Telephone Encounter (Signed)
Patient said he is scheduled for surgeon appt 11/15 but thought he was suppose to get a CT first   (No CT ordered)

## 2017-03-14 NOTE — Telephone Encounter (Signed)
CT order from 03/12/17 is visible in Epic.  I don't see that it has been scheduled yet.  I will cc Carlon on this for assistance.

## 2017-03-20 ENCOUNTER — Ambulatory Visit: Payer: Managed Care, Other (non HMO) | Admitting: General Surgery

## 2017-03-31 ENCOUNTER — Ambulatory Visit (HOSPITAL_COMMUNITY): Payer: Managed Care, Other (non HMO)

## 2017-04-01 ENCOUNTER — Telehealth: Payer: Self-pay | Admitting: Family

## 2017-04-01 NOTE — Telephone Encounter (Signed)
I do not see CT completed. Where did he have this done at?

## 2017-04-02 NOTE — Telephone Encounter (Signed)
It can be viewed in [place]. Corey Cruz can print out a copy if you need her to

## 2017-04-03 ENCOUNTER — Encounter: Payer: Self-pay | Admitting: General Surgery

## 2017-04-03 ENCOUNTER — Ambulatory Visit (INDEPENDENT_AMBULATORY_CARE_PROVIDER_SITE_OTHER): Payer: Managed Care, Other (non HMO) | Admitting: General Surgery

## 2017-04-03 VITALS — BP 120/84 | HR 84 | Temp 98.0°F | Resp 18 | Ht 71.0 in | Wt 219.0 lb

## 2017-04-03 DIAGNOSIS — K4090 Unilateral inguinal hernia, without obstruction or gangrene, not specified as recurrent: Secondary | ICD-10-CM | POA: Diagnosis not present

## 2017-04-03 NOTE — H&P (Signed)
[facility] History and Physical  Reason for Referral: Right inguinal hernia  Referring Physician: Evelina Dun NP  Chief Complaint    Inguinal Hernia      Corey Cruz is a 46 y.o. male.  HPI: Corey Cruz is an active 46 yo who works in [employer] and bowls regularly who first started noticing a bulge in his right groin and into his scrotum a few months ago after bowling. He reports that this has continued to get larger, and the hernia at times get stuck out and has been hard.  He has always been able to reduce the hernia, but went to see his PCP and had a CT scan performed that demonstrated a right sliding inguinal hernia with bladder involved.  He otherwise says he is well. He has never appreciated any symptoms on the left but the CT did identify an inguinal hernia with fat on that side. He denies any nausea or vomiting. He has regular BMs.    Past Medical History:  Diagnosis Date  . Diabetes mellitus without complication ([place])   . Hyperlipidemia   . Hypertension   . Lumbar disc disorder     History reviewed. No pertinent surgical history.  Family History  Problem Relation Age of Onset  . Cancer Mother        lung  . Cancer Brother        leukemia    Social History   Tobacco Use  . Smoking status: Never Smoker  . Smokeless tobacco: Never Used  Substance Use Topics  . Alcohol use: Yes    Alcohol/week: 0.6 oz    Types: 1 Shots of liquor per week  . Drug use: No    Medications: I have reviewed the patient's current medications. Allergies as of 04/03/2017   No Known Allergies     Medication List        Accurate as of 04/03/17  3:26 PM. Always use your most recent med list.          aspirin EC 81 MG tablet Take 1 tablet (81 mg total) by mouth daily.   atorvastatin 20 MG tablet Commonly known as:  LIPITOR TAKE 1 TABLET BY MOUTH ONCE DAILY   JARDIANCE 10 MG Tabs tablet Generic drug:  empagliflozin TAKE 1 TABLET BY MOUTH ONCE  DAILY   lisinopril 10 MG tablet Commonly known as:  PRINIVIL,ZESTRIL TAKE 1 TABLET BY MOUTH ONCE DAILY   meloxicam 15 MG tablet Commonly known as:  MOBIC TAKE 1 TABLET BY MOUTH ONCE DAILY   metFORMIN 1000 MG tablet Commonly known as:  GLUCOPHAGE TAKE 1 TABLET BY MOUTH TWICE DAILY WITH MEALS   sildenafil 100 MG tablet Commonly known as:  VIAGRA Take 0.5-1 tablets (50-100 mg total) by mouth daily as needed for erectile dysfunction.   Vitamin D (Ergocalciferol) 50000 units Caps capsule Commonly known as:  DRISDOL TAKE ONE CAPSULE BY MOUTH ONCE A WEEK        ROS:  A comprehensive review of systems was negative except for: Cardiovascular: positive for hypertension Genitourinary: positive for frequency Musculoskeletal: positive for back pain Neurological: positive for numbness in right great toe  Blood pressure 120/84, pulse 84, temperature 98 F (36.7 C), resp. rate 18, height 5\' 11"  (1.803 m), weight 219 lb (99.3 kg). Physical Exam  Constitutional: He is oriented to person, place, and time and well-developed, well-nourished, and in no distress.  HENT:  Head: Normocephalic.  Eyes: Pupils are equal, round, and reactive to light.  Neck:  Normal range of motion.  Cardiovascular: Normal rate and regular rhythm.  Pulmonary/Chest: Effort normal.  Abdominal: Soft. He exhibits no distension. There is no tenderness. A hernia is present. Hernia confirmed positive in the right inguinal area and confirmed positive in the left inguinal area.  Right inguinal hernia reducible, large patulous ring, left inguinal hernia, reducbile  Musculoskeletal: Normal range of motion.  Neurological: He is alert and oriented to person, place, and time.  Skin: Skin is warm and dry.  Psychiatric: Mood, memory, affect and judgment normal.  Vitals reviewed.   Results: OSH CT [facility] 03/31/2017 Personally reviewed CT on CD patient brought to clinic, small tongue of bladder extending into the right inguinal  hernia with fat, no bowel, left inguinal hernia with fat       Assessment & Plan:  Corey Cruz is a 46 y.o. male with bilateral inguinal hernias with the right side being symptomatic and the left side being found incidentally on Ct scan.  I have discussed that he has both inguinal hernias and that this can be done both open and laparoscopically but that I do open procedures.  We discussed that the difference in laparoscopic and open hernias and the argument behind both.   The patient would like to undergo bilateral open hernia repair with mesh.  We discussed the risk and benefits including, bleeding, infection, use of mesh, risk of recurrence, risk of nerve damage causing numbness or changes in sensation, risk of damage to the cord structures. The patient understands the risk and benefits of repair with mesh, and has decided to proceed.  -He understands the likelihood of scrotal swelling and difficulty with urination and possible need for overnight admission, esp given that we are repairing both  -He would like to get both done due to his job and short term disability limitations.  -Scheduled for 04/16/17   All questions were answered to the satisfaction of the patient and family.     Corey Cruz 04/03/2017, 3:26 PM

## 2017-04-03 NOTE — Progress Notes (Signed)
[facility] History and Physical  Reason for Referral: Right inguinal hernia  Referring Physician: Evelina Dun NP  Chief Complaint    Inguinal Hernia      Corey Cruz is a 46 y.o. male.  HPI: Corey Cruz is an active 46 yo who works in [employer] and bowls regularly who first started noticing a bulge in his right groin and into his scrotum a few months ago after bowling. He reports that this has continued to get larger, and the hernia at times get stuck out and has been hard.  He has always been able to reduce the hernia, but went to see his PCP and had a CT scan performed that demonstrated a right sliding inguinal hernia with bladder involved.  He otherwise says he is well. He has never appreciated any symptoms on the left but the CT did identify an inguinal hernia with fat on that side. He denies any nausea or vomiting. He has regular BMs.    Past Medical History:  Diagnosis Date  . Diabetes mellitus without complication ([place])   . Hyperlipidemia   . Hypertension   . Lumbar disc disorder     History reviewed. No pertinent surgical history.  Family History  Problem Relation Age of Onset  . Cancer Mother        lung  . Cancer Brother        leukemia    Social History   Tobacco Use  . Smoking status: Never Smoker  . Smokeless tobacco: Never Used  Substance Use Topics  . Alcohol use: Yes    Alcohol/week: 0.6 oz    Types: 1 Shots of liquor per week  . Drug use: No    Medications: I have reviewed the patient's current medications. Allergies as of 04/03/2017   No Known Allergies     Medication List        Accurate as of 04/03/17  3:26 PM. Always use your most recent med list.          aspirin EC 81 MG tablet Take 1 tablet (81 mg total) by mouth daily.   atorvastatin 20 MG tablet Commonly known as:  LIPITOR TAKE 1 TABLET BY MOUTH ONCE DAILY   JARDIANCE 10 MG Tabs tablet Generic drug:  empagliflozin TAKE 1 TABLET BY MOUTH ONCE  DAILY   lisinopril 10 MG tablet Commonly known as:  PRINIVIL,ZESTRIL TAKE 1 TABLET BY MOUTH ONCE DAILY   meloxicam 15 MG tablet Commonly known as:  MOBIC TAKE 1 TABLET BY MOUTH ONCE DAILY   metFORMIN 1000 MG tablet Commonly known as:  GLUCOPHAGE TAKE 1 TABLET BY MOUTH TWICE DAILY WITH MEALS   sildenafil 100 MG tablet Commonly known as:  VIAGRA Take 0.5-1 tablets (50-100 mg total) by mouth daily as needed for erectile dysfunction.   Vitamin D (Ergocalciferol) 50000 units Caps capsule Commonly known as:  DRISDOL TAKE ONE CAPSULE BY MOUTH ONCE A WEEK        ROS:  A comprehensive review of systems was negative except for: Cardiovascular: positive for hypertension Genitourinary: positive for frequency Musculoskeletal: positive for back pain Neurological: positive for numbness in right great toe  Blood pressure 120/84, pulse 84, temperature 98 F (36.7 C), resp. rate 18, height 5\' 11"  (1.803 m), weight 219 lb (99.3 kg). Physical Exam  Constitutional: He is oriented to person, place, and time and well-developed, well-nourished, and in no distress.  HENT:  Head: Normocephalic.  Eyes: Pupils are equal, round, and reactive to light.  Neck:  Normal range of motion.  Cardiovascular: Normal rate and regular rhythm.  Pulmonary/Chest: Effort normal.  Abdominal: Soft. He exhibits no distension. There is no tenderness. A hernia is present. Hernia confirmed positive in the right inguinal area and confirmed positive in the left inguinal area.  Right inguinal hernia reducible, large patulous ring, left inguinal hernia, reducbile  Musculoskeletal: Normal range of motion.  Neurological: He is alert and oriented to person, place, and time.  Skin: Skin is warm and dry.  Psychiatric: Mood, memory, affect and judgment normal.  Vitals reviewed.   Results: OSH CT [facility] 03/31/2017 Personally reviewed CT on CD patient brought to clinic, small tongue of bladder extending into the right inguinal  hernia with fat, no bowel, left inguinal hernia with fat       Assessment & Plan:  Corey Cruz is a 46 y.o. male with bilateral inguinal hernias with the right side being symptomatic and the left side being found incidentally on Ct scan.  I have discussed that he has both inguinal hernias and that this can be done both open and laparoscopically but that I do open procedures.  We discussed that the difference in laparoscopic and open hernias and the argument behind both.   The patient would like to undergo bilateral open hernia repair with mesh.  We discussed the risk and benefits including, bleeding, infection, use of mesh, risk of recurrence, risk of nerve damage causing numbness or changes in sensation, risk of damage to the cord structures. The patient understands the risk and benefits of repair with mesh, and has decided to proceed.  -He understands the likelihood of scrotal swelling and difficulty with urination and possible need for overnight admission, esp given that we are repairing both  -He would like to get both done due to his job and short term disability limitations.  -Scheduled for 04/16/17   All questions were answered to the satisfaction of the patient and family.     Virl Cagey 04/03/2017, 3:26 PM

## 2017-04-03 NOTE — Patient Instructions (Addendum)
Inguinal Hernia, Adult An inguinal hernia is when fat or the intestines push through the area where the leg meets the lower belly (groin) and make a rounded lump (bulge). This condition happens over time. There are three types of inguinal hernias. These types include:  Hernias that can be pushed back into the belly (are reducible).  Hernias that cannot be pushed back into the belly (are incarcerated).  Hernias that cannot be pushed back into the belly and lose their blood supply (get strangulated). This type needs emergency surgery.  Follow these instructions at home: Lifestyle  Drink enough fluid to keep your urine (pee) clear or pale yellow.  Eat plenty of fruits, vegetables, and whole grains. These have a lot of fiber. Talk with your doctor if you have questions.  Avoid lifting heavy objects.  Avoid standing for long periods of time.  Do not use tobacco products. These include cigarettes, chewing tobacco, or e-cigarettes. If you need help quitting, ask your doctor.  Try to stay at a healthy weight. General instructions  Do not try to force the hernia back in.  Watch your hernia for any changes in color or size. Let your doctor know if there are any changes.  Take over-the-counter and prescription medicines only as told by your doctor.  Keep all follow-up visits as told by your doctor. This is important. Contact a doctor if:  You have a fever.  You have new symptoms.  Your symptoms get worse. Get help right away if:  The area where the legs meets the lower belly has: ? Pain that gets worse suddenly. ? A bulge that gets bigger suddenly and does not go down. ? A bulge that turns red or purple. ? A bulge that is painful to the touch.  You are a man and your scrotum: ? Suddenly feels painful. ? Suddenly changes in size.  You feel sick to your stomach (nauseous) and this feeling does not go away.  You throw up (vomit) and this keeps happening.  You feel your heart  beating a lot more quickly than normal.  You cannot poop (have a bowel movement) or pass gas. This information is not intended to replace advice given to you by your health care provider. Make sure you discuss any questions you have with your health care provider. Document Released: 05/23/2006 Document Revised: 09/28/2015 Document Reviewed: 03/02/2014 Elsevier Interactive Patient Education  2018 [employer]  

## 2017-04-07 NOTE — Telephone Encounter (Signed)
Patient has seen a general surgeon and is scheduled for surgery

## 2017-04-07 NOTE — Telephone Encounter (Signed)
CT shows Moderate-sized right adrenal inguinal hernia. Keep all appts with general surgery. Looks like he is scheduled for hernia repair on 04/16/17.

## 2017-04-10 NOTE — Patient Instructions (Signed)
Corey Cruz  04/10/2017     @PREFPERIOPPHARMACY @   Your procedure is scheduled on  04/16/2017 .  Report to [facility] at  810   A.M.  Call this number if you have problems the morning of surgery:  [phone]   Remember:  Do not eat food or drink liquids after midnight.  Take these medicines the morning of surgery with A SIP OF WATER  mobic.   Do not wear jewelry, make-up or nail polish.  Do not wear lotions, powders, or perfumes, or deoderant.  Do not shave 48 hours prior to surgery.  Men may shave face and neck.  Do not bring valuables to the hospital.  [facility] is not responsible for any belongings or valuables.  Contacts, dentures or bridgework may not be worn into surgery.  Leave your suitcase in the car.  After surgery it may be brought to your room.  For patients admitted to the hospital, discharge time will be determined by your treatment team.  Patients discharged the day of surgery will not be allowed to drive home.   Name and phone number of your driver:   family Special instructions:  None  Please read over the following fact sheets that you were given. Anesthesia Post-op Instructions and Care and Recovery After Surgery       Open Hernia Repair, Adult Open hernia repair is a surgical procedure to fix a hernia. A hernia occurs when an internal organ or tissue pushes out through a weak spot in the abdominal wall muscles. Hernias commonly occur in the groin and around the navel. Most hernias tend to get worse over time. Often, surgery is done to prevent the hernia from becoming bigger, uncomfortable, or an emergency. Emergency surgery may be needed if abdominal contents get stuck in the opening (incarcerated hernia) or the blood supply gets cut off (strangulated hernia). In an open repair, an incision is made in the abdomen to perform the surgery. Tell a health care provider about:  Any allergies you have.  All medicines you are  taking, including vitamins, herbs, eye drops, creams, and over-the-counter medicines.  Any problems you or family members have had with anesthetic medicines.  Any blood or bone disorders you have.  Any surgeries you have had.  Any medical conditions you have, including any recent cold or flu symptoms.  Whether you are pregnant or may be pregnant. What are the risks? Generally, this is a safe procedure. However, problems may occur, including:  Long-lasting (chronic) pain.  Bleeding.  Infection.  Damage to the testicle. This can cause shrinking or swelling.  Damage to the bladder, blood vessels, intestine, or nerves near the hernia.  Trouble passing urine.  Allergic reactions to medicines.  Return of the hernia.  What happens before the procedure? Staying hydrated Follow instructions from your health care provider about hydration, which may include:  Up to 2 hours before the procedure - you may continue to drink clear liquids, such as water, clear fruit juice, black coffee, and plain tea.  Eating and drinking restrictions Follow instructions from your health care provider about eating and drinking, which may include:  8 hours before the procedure - stop eating heavy meals or foods such as meat, fried foods, or fatty foods.  6 hours before the procedure - stop eating light meals or foods, such as toast or cereal.  6 hours before the procedure - stop drinking milk or drinks that  contain milk.  2 hours before the procedure - stop drinking clear liquids.  Medicines  Ask your health care provider about: ? Changing or stopping your regular medicines. This is especially important if you are taking diabetes medicines or blood thinners. ? Taking medicines such as aspirin and ibuprofen. These medicines can thin your blood. Do not take these medicines before your procedure if your health care provider instructs you not to.  You may be given antibiotic medicine to help prevent  infection. General instructions  You may have blood tests or imaging studies.  Ask your health care provider how your surgical site will be marked or identified.  If you smoke, do not smoke for at least 2 weeks before your procedure or for as long as told by your health care provider.  Let your health care provider know if you develop a cold or any infection before your surgery.  Plan to have someone take you home from the hospital or clinic.  If you will be going home right after the procedure, plan to have someone with you for 24 hours. What happens during the procedure?  To reduce your risk of infection: ? Your health care team will wash or sanitize their hands. ? Your skin will be washed with soap. ? Hair may be removed from the surgical area.  An IV tube will be inserted into one of your veins.  You will be given one or more of the following: ? A medicine to help you relax (sedative). ? A medicine to numb the area (local anesthetic). ? A medicine to make you fall asleep (general anesthetic).  Your surgeon will make an incision over the hernia.  The tissues of the hernia will be moved back into place.  The edges of the hernia may be stitched together.  The opening in the abdominal muscles will be closed with stitches (sutures). Or, your surgeon will place a mesh patch made of manmade (synthetic) material over the opening.  The incision will be closed.  A bandage (dressing) may be placed over the incision. The procedure may vary among health care providers and hospitals. What happens after the procedure?  Your blood pressure, heart rate, breathing rate, and blood oxygen level will be monitored until the medicines you were given have worn off.  You may be given medicine for pain.  Do not drive for 24 hours if you received a sedative. This information is not intended to replace advice given to you by your health care provider. Make sure you discuss any questions you  have with your health care provider. Document Released: 10/16/2000 Document Revised: 11/10/2015 Document Reviewed: 10/04/2015 Elsevier Interactive Patient Education  2018 [place], Adult, Care After These instructions give you information about caring for yourself after your procedure. Your doctor may also give you more specific instructions. If you have problems or questions, contact your doctor. Follow these instructions at home: Surgical cut (incision) care   Follow instructions from your doctor about how to take care of your surgical cut area. Make sure you: ? Wash your hands with soap and water before you change your bandage (dressing). If you cannot use soap and water, use hand sanitizer. ? Change your bandage as told by your doctor. ? Leave stitches (sutures), skin glue, or skin tape (adhesive) strips in place. They may need to stay in place for 2 weeks or longer. If tape strips get loose and curl up, you may trim the loose edges. Do  not remove tape strips completely unless your doctor says it is okay.  Check your surgical cut every day for signs of infection. Check for: ? More redness, swelling, or pain. ? More fluid or blood. ? Warmth. ? Pus or a bad smell. Activity  Do not drive or use heavy machinery while taking prescription pain medicine. Do not drive until your doctor says it is okay.  Until your doctor says it is okay: ? Do not lift anything that is heavier than 10 lb (4.5 kg). ? Do not play contact sports.  Return to your normal activities as told by your doctor. Ask your doctor what activities are safe. General instructions  To prevent or treat having a hard time pooping (constipation) while you are taking prescription pain medicine, your doctor may recommend that you: ? Drink enough fluid to keep your pee (urine) clear or pale yellow. ? Take over-the-counter or prescription medicines. ? Eat foods that are high in fiber, such as fresh  fruits and vegetables, whole grains, and beans. ? Limit foods that are high in fat and processed sugars, such as fried and sweet foods.  Take over-the-counter and prescription medicines only as told by your doctor.  Do not take baths, swim, or use a hot tub until your doctor says it is okay.  Keep all follow-up visits as told by your doctor. This is important. Contact a doctor if:  You develop a rash.  You have more redness, swelling, or pain around your surgical cut.  You have more fluid or blood coming from your surgical cut.  Your surgical cut feels warm to the touch.  You have pus or a bad smell coming from your surgical cut.  You have a fever or chills.  You have blood in your poop (stool).  You have not pooped in 2-3 days.  Medicine does not help your pain. Get help right away if:  You have chest pain or you are short of breath.  You feel light-headed.  You feel weak and dizzy (feel faint).  You have very bad pain.  You throw up (vomit) and your pain is worse. This information is not intended to replace advice given to you by your health care provider. Make sure you discuss any questions you have with your health care provider. Document Released: 05/13/2014 Document Revised: 11/10/2015 Document Reviewed: 10/04/2015 Elsevier Interactive Patient Education  2017 [place] Anesthesia, Adult General anesthesia is the use of medicines to make a person "go to sleep" (be unconscious) for a medical procedure. General anesthesia is often recommended when a procedure:  Is long.  Requires you to be still or in an unusual position.  Is major and can cause you to lose blood.  Is impossible to do without general anesthesia.  The medicines used for general anesthesia are called general anesthetics. In addition to making you sleep, the medicines:  Prevent pain.  Control your blood pressure.  Relax your muscles.  Tell a health care provider about:  Any  allergies you have.  All medicines you are taking, including vitamins, herbs, eye drops, creams, and over-the-counter medicines.  Any problems you or family members have had with anesthetic medicines.  Types of anesthetics you have had in the past.  Any bleeding disorders you have.  Any surgeries you have had.  Any medical conditions you have.  Any history of heart or lung conditions, such as heart failure, sleep apnea, or chronic obstructive pulmonary disease (COPD).  Whether you are pregnant  or may be pregnant.  Whether you use tobacco, alcohol, marijuana, or street drugs.  Any history of Armed forces logistics/support/administrative officer.  Any history of depression or anxiety. What are the risks? Generally, this is a safe procedure. However, problems may occur, including:  Allergic reaction to anesthetics.  Lung and heart problems.  Inhaling food or liquids from your stomach into your lungs (aspiration).  Injury to nerves.  Waking up during your procedure and being unable to move (rare).  Extreme agitation or a state of mental confusion (delirium) when you wake up from the anesthetic.  Air in the bloodstream, which can lead to stroke.  These problems are more likely to develop if you are having a major surgery or if you have an advanced medical condition. You can prevent some of these complications by answering all of your health care provider's questions thoroughly and by following all pre-procedure instructions. General anesthesia can cause side effects, including:  Nausea or vomiting  A sore throat from the breathing tube.  Feeling cold or shivery.  Feeling tired, washed out, or achy.  Sleepiness or drowsiness.  Confusion or agitation.  What happens before the procedure? Staying hydrated Follow instructions from your health care provider about hydration, which may include:  Up to 2 hours before the procedure - you may continue to drink clear liquids, such as water, clear fruit juice,  black coffee, and plain tea.  Eating and drinking restrictions Follow instructions from your health care provider about eating and drinking, which may include:  8 hours before the procedure - stop eating heavy meals or foods such as meat, fried foods, or fatty foods.  6 hours before the procedure - stop eating light meals or foods, such as toast or cereal.  6 hours before the procedure - stop drinking milk or drinks that contain milk.  2 hours before the procedure - stop drinking clear liquids.  Medicines  Ask your health care provider about: ? Changing or stopping your regular medicines. This is especially important if you are taking diabetes medicines or blood thinners. ? Taking medicines such as aspirin and ibuprofen. These medicines can thin your blood. Do not take these medicines before your procedure if your health care provider instructs you not to. ? Taking new dietary supplements or medicines. Do not take these during the week before your procedure unless your health care provider approves them.  If you are told to take a medicine or to continue taking a medicine on the day of the procedure, take the medicine with sips of water. General instructions   Ask if you will be going home the same day, the following day, or after a longer hospital stay. ? Plan to have someone take you home. ? Plan to have someone stay with you for the first 24 hours after you leave the hospital or clinic.  For 3-6 weeks before the procedure, try not to use any tobacco products, such as cigarettes, chewing tobacco, and e-cigarettes.  You may brush your teeth on the morning of the procedure, but make sure to spit out the toothpaste. What happens during the procedure?  You will be given anesthetics through a mask and through an IV tube in one of your veins.  You may receive medicine to help you relax (sedative).  As soon as you are asleep, a breathing tube may be used to help you breathe.  An  anesthesia specialist will stay with you throughout the procedure. He or she will help keep you comfortable and  safe by continuing to give you medicines and adjusting the amount of medicine that you get. He or she will also watch your blood pressure, pulse, and oxygen levels to make sure that the anesthetics do not cause any problems.  If a breathing tube was used to help you breathe, it will be removed before you wake up. The procedure may vary among health care providers and hospitals. What happens after the procedure?  You will wake up, often slowly, after the procedure is complete, usually in a recovery area.  Your blood pressure, heart rate, breathing rate, and blood oxygen level will be monitored until the medicines you were given have worn off.  You may be given medicine to help you calm down if you feel anxious or agitated.  If you will be going home the same day, your health care provider may check to make sure you can stand, drink, and urinate.  Your health care providers will treat your pain and side effects before you go home.  Do not drive for 24 hours if you received a sedative.  You may: ? Feel nauseous and vomit. ? Have a sore throat. ? Have mental slowness. ? Feel cold or shivery. ? Feel sleepy. ? Feel tired. ? Feel sore or achy, even in parts of your body where you did not have surgery. This information is not intended to replace advice given to you by your health care provider. Make sure you discuss any questions you have with your health care provider. Document Released: 07/30/2007 Document Revised: 10/03/2015 Document Reviewed: 04/06/2015 Elsevier Interactive Patient Education  2018 [place] Anesthesia, Adult, Care After These instructions provide you with information about caring for yourself after your procedure. Your health care provider may also give you more specific instructions. Your treatment has been planned according to current medical  practices, but problems sometimes occur. Call your health care provider if you have any problems or questions after your procedure. What can I expect after the procedure? After the procedure, it is common to have:  Vomiting.  A sore throat.  Mental slowness.  It is common to feel:  Nauseous.  Cold or shivery.  Sleepy.  Tired.  Sore or achy, even in parts of your body where you did not have surgery.  Follow these instructions at home: For at least 24 hours after the procedure:  Do not: ? Participate in activities where you could fall or become injured. ? Drive. ? Use heavy machinery. ? Drink alcohol. ? Take sleeping pills or medicines that cause drowsiness. ? Make important decisions or sign legal documents. ? Take care of children on your own.  Rest. Eating and drinking  If you vomit, drink water, juice, or soup when you can drink without vomiting.  Drink enough fluid to keep your urine clear or pale yellow.  Make sure you have little or no nausea before eating solid foods.  Follow the diet recommended by your health care provider. General instructions  Have a responsible adult stay with you until you are awake and alert.  Return to your normal activities as told by your health care provider. Ask your health care provider what activities are safe for you.  Take over-the-counter and prescription medicines only as told by your health care provider.  If you smoke, do not smoke without supervision.  Keep all follow-up visits as told by your health care provider. This is important. Contact a health care provider if:  You continue to have nausea or vomiting  at home, and medicines are not helpful.  You cannot drink fluids or start eating again.  You cannot urinate after 8-12 hours.  You develop a skin rash.  You have fever.  You have increasing redness at the site of your procedure. Get help right away if:  You have difficulty breathing.  You have  chest pain.  You have unexpected bleeding.  You feel that you are having a life-threatening or urgent problem. This information is not intended to replace advice given to you by your health care provider. Make sure you discuss any questions you have with your health care provider. Document Released: 07/29/2000 Document Revised: 09/25/2015 Document Reviewed: 04/06/2015 Elsevier Interactive Patient Education  [employer].

## 2017-04-11 ENCOUNTER — Other Ambulatory Visit: Payer: Self-pay

## 2017-04-11 ENCOUNTER — Encounter (HOSPITAL_COMMUNITY)
Admission: RE | Admit: 2017-04-11 | Discharge: 2017-04-11 | Disposition: A | Discharge status: Home / Self Care | Payer: Managed Care, Other (non HMO) | Source: Ambulatory Visit | Attending: General Surgery | Admitting: General Surgery

## 2017-04-11 ENCOUNTER — Encounter (HOSPITAL_COMMUNITY): Payer: Self-pay

## 2017-04-11 DIAGNOSIS — I10 Essential (primary) hypertension: Secondary | ICD-10-CM | POA: Insufficient documentation

## 2017-04-11 DIAGNOSIS — E119 Type 2 diabetes mellitus without complications: Secondary | ICD-10-CM | POA: Diagnosis not present

## 2017-04-11 DIAGNOSIS — Z01812 Encounter for preprocedural laboratory examination: Secondary | ICD-10-CM | POA: Insufficient documentation

## 2017-04-11 DIAGNOSIS — Z7984 Long term (current) use of oral hypoglycemic drugs: Secondary | ICD-10-CM | POA: Diagnosis not present

## 2017-04-11 DIAGNOSIS — Z01810 Encounter for preprocedural cardiovascular examination: Secondary | ICD-10-CM | POA: Diagnosis present

## 2017-04-11 HISTORY — DX: Unspecified osteoarthritis, unspecified site: M19.90

## 2017-04-11 LAB — CBC WITH DIFFERENTIAL/PLATELET
BASOS ABS: 0 10*3/uL (ref 0.0–0.1)
Basophils Relative: 0 %
Eosinophils Absolute: 0.1 10*3/uL (ref 0.0–0.7)
Eosinophils Relative: 1 %
HEMATOCRIT: 47.2 % (ref 39.0–52.0)
Hemoglobin: 16.1 g/dL (ref 13.0–17.0)
LYMPHS PCT: 35 %
Lymphs Abs: 3.2 10*3/uL (ref 0.7–4.0)
MCH: 32 pg (ref 26.0–34.0)
MCHC: 34.1 g/dL (ref 30.0–36.0)
MCV: 93.8 fL (ref 78.0–100.0)
MONO ABS: 1 10*3/uL (ref 0.1–1.0)
Monocytes Relative: 11 %
NEUTROS ABS: 4.8 10*3/uL (ref 1.7–7.7)
Neutrophils Relative %: 53 %
Platelets: 287 10*3/uL (ref 150–400)
RBC: 5.03 MIL/uL (ref 4.22–5.81)
RDW: 12 % (ref 11.5–15.5)
WBC: 9.1 10*3/uL (ref 4.0–10.5)

## 2017-04-11 LAB — BASIC METABOLIC PANEL
ANION GAP: 9 (ref 5–15)
BUN: 17 mg/dL (ref 6–20)
CO2: 27 mmol/L (ref 22–32)
Calcium: 9.6 mg/dL (ref 8.9–10.3)
Chloride: 99 mmol/L — ABNORMAL LOW (ref 101–111)
Creatinine, Ser: 0.78 mg/dL (ref 0.61–1.24)
GFR calc Af Amer: >60 mL/min (ref >60)
GFR calc non Af Amer: >60 mL/min (ref >60)
GLUCOSE: 123 mg/dL — AB (ref 65–99)
POTASSIUM: 3.8 mmol/L (ref 3.5–5.1)
Sodium: 135 mmol/L (ref 135–145)

## 2017-04-16 ENCOUNTER — Encounter (HOSPITAL_COMMUNITY): Payer: Self-pay | Admitting: *Deleted

## 2017-04-16 ENCOUNTER — Observation Stay (HOSPITAL_COMMUNITY)
Admission: RE | Admit: 2017-04-16 | Discharge: 2017-04-16 | Disposition: A | Discharge status: Home / Self Care | Payer: Managed Care, Other (non HMO) | Source: Ambulatory Visit | Attending: General Surgery | Admitting: General Surgery

## 2017-04-16 ENCOUNTER — Ambulatory Visit (HOSPITAL_COMMUNITY): Payer: Managed Care, Other (non HMO) | Admitting: Anesthesiology

## 2017-04-16 ENCOUNTER — Encounter (HOSPITAL_COMMUNITY)
Admission: RE | Disposition: A | Discharge status: Home / Self Care | Payer: Self-pay | Source: Ambulatory Visit | Attending: General Surgery

## 2017-04-16 DIAGNOSIS — K4090 Unilateral inguinal hernia, without obstruction or gangrene, not specified as recurrent: Secondary | ICD-10-CM

## 2017-04-16 DIAGNOSIS — D176 Benign lipomatous neoplasm of spermatic cord: Secondary | ICD-10-CM | POA: Insufficient documentation

## 2017-04-16 DIAGNOSIS — Z791 Long term (current) use of non-steroidal anti-inflammatories (NSAID): Secondary | ICD-10-CM | POA: Diagnosis not present

## 2017-04-16 DIAGNOSIS — Z79899 Other long term (current) drug therapy: Secondary | ICD-10-CM | POA: Insufficient documentation

## 2017-04-16 DIAGNOSIS — Z801 Family history of malignant neoplasm of trachea, bronchus and lung: Secondary | ICD-10-CM | POA: Insufficient documentation

## 2017-04-16 DIAGNOSIS — Z806 Family history of leukemia: Secondary | ICD-10-CM | POA: Insufficient documentation

## 2017-04-16 DIAGNOSIS — Z7982 Long term (current) use of aspirin: Secondary | ICD-10-CM | POA: Insufficient documentation

## 2017-04-16 DIAGNOSIS — N529 Male erectile dysfunction, unspecified: Secondary | ICD-10-CM | POA: Insufficient documentation

## 2017-04-16 DIAGNOSIS — K4020 Bilateral inguinal hernia, without obstruction or gangrene, not specified as recurrent: Principal | ICD-10-CM | POA: Diagnosis present

## 2017-04-16 DIAGNOSIS — I10 Essential (primary) hypertension: Secondary | ICD-10-CM | POA: Insufficient documentation

## 2017-04-16 DIAGNOSIS — E119 Type 2 diabetes mellitus without complications: Secondary | ICD-10-CM | POA: Insufficient documentation

## 2017-04-16 DIAGNOSIS — E785 Hyperlipidemia, unspecified: Secondary | ICD-10-CM | POA: Insufficient documentation

## 2017-04-16 DIAGNOSIS — Z7984 Long term (current) use of oral hypoglycemic drugs: Secondary | ICD-10-CM | POA: Insufficient documentation

## 2017-04-16 HISTORY — PX: INGUINAL HERNIA REPAIR: SHX194

## 2017-04-16 LAB — GLUCOSE, CAPILLARY
GLUCOSE-CAPILLARY: 105 mg/dL — AB (ref 65–99)
Glucose-Capillary: 92 mg/dL (ref 65–99)
Glucose-Capillary: 107 mg/dL — ABNORMAL HIGH (ref 65–99)

## 2017-04-16 SURGERY — REPAIR, HERNIA, INGUINAL, ADULT
Anesthesia: General | Site: Abdomen | Laterality: Bilateral

## 2017-04-16 MED ORDER — ATORVASTATIN CALCIUM 20 MG PO TABS: 20.0000 mg | ORAL_TABLET | Freq: Every day | ORAL | Status: DC

## 2017-04-16 MED ORDER — NEOSTIGMINE METHYLSULFATE 10 MG/10ML IV SOLN
INTRAVENOUS | Status: DC | PRN
  Administered 2017-04-16: 2 mg via INTRAVENOUS

## 2017-04-16 MED ORDER — ONDANSETRON 4 MG PO TBDP: 4.0000 mg | ORAL_TABLET | Freq: Four times a day (QID) | ORAL | Status: DC | PRN

## 2017-04-16 MED ORDER — DIPHENHYDRAMINE HCL 12.5 MG/5ML PO ELIX: 12.5000 mg | ORAL_SOLUTION | Freq: Four times a day (QID) | ORAL | Status: DC | PRN

## 2017-04-16 MED ORDER — ONDANSETRON HCL 4 MG/2ML IJ SOLN: 4.0000 mg | Freq: Four times a day (QID) | INTRAMUSCULAR | Status: DC | PRN

## 2017-04-16 MED ORDER — FENTANYL CITRATE (PF) 250 MCG/5ML IJ SOLN
INTRAMUSCULAR | Status: AC
  Filled 2017-04-16: qty 5

## 2017-04-16 MED ORDER — GLYCOPYRROLATE 0.2 MG/ML IJ SOLN
INTRAMUSCULAR | Status: DC | PRN
  Administered 2017-04-16: 0.4 mg via INTRAVENOUS

## 2017-04-16 MED ORDER — FENTANYL CITRATE (PF) 100 MCG/2ML IJ SOLN
INTRAMUSCULAR | Status: DC | PRN
  Administered 2017-04-16 (×2): 50 ug via INTRAVENOUS
  Administered 2017-04-16: 100 ug via INTRAVENOUS
  Administered 2017-04-16: 50 ug via INTRAVENOUS

## 2017-04-16 MED ORDER — 0.9 % SODIUM CHLORIDE (POUR BTL) OPTIME
TOPICAL | Status: DC | PRN
  Administered 2017-04-16: 1000 mL

## 2017-04-16 MED ORDER — LACTATED RINGERS IV SOLN
INTRAVENOUS | Status: DC
  Administered 2017-04-16: 09:00:00 via INTRAVENOUS

## 2017-04-16 MED ORDER — NEOSTIGMINE METHYLSULFATE 10 MG/10ML IV SOLN
INTRAVENOUS | Status: AC
  Filled 2017-04-16: qty 2

## 2017-04-16 MED ORDER — FENTANYL CITRATE (PF) 100 MCG/2ML IJ SOLN
25.0000 ug | INTRAMUSCULAR | Status: AC | PRN
  Administered 2017-04-16 (×2): 25 ug via INTRAVENOUS

## 2017-04-16 MED ORDER — OXYCODONE HCL 5 MG PO TABS
5.0000 mg | ORAL_TABLET | ORAL | Status: DC | PRN
  Administered 2017-04-16: 5 mg via ORAL
  Filled 2017-04-16: qty 1

## 2017-04-16 MED ORDER — BUPIVACAINE LIPOSOME 1.3 % IJ SUSP
INTRAMUSCULAR | Status: DC | PRN
  Administered 2017-04-16: 10 mL

## 2017-04-16 MED ORDER — CANAGLIFLOZIN 100 MG PO TABS: 100.0000 mg | ORAL_TABLET | Freq: Every day | ORAL | Status: DC

## 2017-04-16 MED ORDER — MORPHINE SULFATE (PF) 2 MG/ML IV SOLN
2.0000 mg | INTRAVENOUS | Status: DC | PRN
  Administered 2017-04-16: 2 mg via INTRAVENOUS

## 2017-04-16 MED ORDER — SODIUM CHLORIDE 0.9 % IV SOLN
INTRAVENOUS | Status: DC | PRN
  Administered 2017-04-16: 12:00:00 via INTRAVENOUS

## 2017-04-16 MED ORDER — LIDOCAINE HCL (CARDIAC) 10 MG/ML IV SOLN
INTRAVENOUS | Status: DC | PRN
  Administered 2017-04-16: 50 mg via INTRAVENOUS

## 2017-04-16 MED ORDER — MIDAZOLAM HCL 2 MG/2ML IJ SOLN
1.0000 mg | INTRAMUSCULAR | Status: DC
  Administered 2017-04-16: 2 mg via INTRAVENOUS

## 2017-04-16 MED ORDER — GLYCOPYRROLATE 0.2 MG/ML IJ SOLN
INTRAMUSCULAR | Status: AC
  Filled 2017-04-16: qty 2

## 2017-04-16 MED ORDER — METOPROLOL TARTRATE 5 MG/5ML IV SOLN: 5.0000 mg | Freq: Four times a day (QID) | INTRAVENOUS | Status: DC | PRN

## 2017-04-16 MED ORDER — PROPOFOL 10 MG/ML IV BOLUS
INTRAVENOUS | Status: DC | PRN
  Administered 2017-04-16: 150 mg via INTRAVENOUS

## 2017-04-16 MED ORDER — LISINOPRIL 10 MG PO TABS: 10.0000 mg | ORAL_TABLET | Freq: Every evening | ORAL | Status: DC

## 2017-04-16 MED ORDER — ONDANSETRON HCL 4 MG/2ML IJ SOLN
INTRAMUSCULAR | Status: AC
  Filled 2017-04-16: qty 2

## 2017-04-16 MED ORDER — GLYCOPYRROLATE 0.2 MG/ML IJ SOLN
INTRAMUSCULAR | Status: AC
  Filled 2017-04-16: qty 1

## 2017-04-16 MED ORDER — BUPIVACAINE LIPOSOME 1.3 % IJ SUSP
INTRAMUSCULAR | Status: AC
  Filled 2017-04-16: qty 20

## 2017-04-16 MED ORDER — CHLORHEXIDINE GLUCONATE CLOTH 2 % EX PADS: 6.0000 | MEDICATED_PAD | Freq: Once | CUTANEOUS | Status: DC

## 2017-04-16 MED ORDER — MORPHINE SULFATE (PF) 2 MG/ML IV SOLN
INTRAVENOUS | Status: AC
Start: 2017-04-16 — End: 2017-04-16
  Filled 2017-04-16: qty 1

## 2017-04-16 MED ORDER — DIPHENHYDRAMINE HCL 50 MG/ML IJ SOLN: 12.5000 mg | Freq: Four times a day (QID) | INTRAMUSCULAR | Status: DC | PRN

## 2017-04-16 MED ORDER — ROCURONIUM BROMIDE 100 MG/10ML IV SOLN
INTRAVENOUS | Status: DC | PRN
  Administered 2017-04-16: 35 mg via INTRAVENOUS
  Administered 2017-04-16: 5 mg via INTRAVENOUS
  Administered 2017-04-16 (×2): 10 mg via INTRAVENOUS

## 2017-04-16 MED ORDER — ROCURONIUM BROMIDE 50 MG/5ML IV SOLN
INTRAVENOUS | Status: AC
  Filled 2017-04-16: qty 1

## 2017-04-16 MED ORDER — DOCUSATE SODIUM 100 MG PO CAPS: 100.0000 mg | ORAL_CAPSULE | Freq: Two times a day (BID) | ORAL | 1 refills | Status: DC | PRN

## 2017-04-16 MED ORDER — OXYCODONE HCL 5 MG PO TABS: 5.0000 mg | ORAL_TABLET | ORAL | 0 refills | Status: DC | PRN

## 2017-04-16 MED ORDER — FENTANYL CITRATE (PF) 100 MCG/2ML IJ SOLN
25.0000 ug | INTRAMUSCULAR | Status: DC | PRN
  Administered 2017-04-16: 50 ug via INTRAVENOUS
  Filled 2017-04-16 (×2): qty 2

## 2017-04-16 MED ORDER — SODIUM CHLORIDE 0.9 % IV SOLN
INTRAVENOUS | Status: DC
Start: 2017-04-16 — End: 2017-04-16
  Administered 2017-04-16: 15:00:00 via INTRAVENOUS

## 2017-04-16 MED ORDER — ONDANSETRON HCL 4 MG/2ML IJ SOLN
4.0000 mg | Freq: Once | INTRAMUSCULAR | Status: AC
  Administered 2017-04-16: 4 mg via INTRAVENOUS

## 2017-04-16 MED ORDER — CEFAZOLIN SODIUM-DEXTROSE 2-4 GM/100ML-% IV SOLN
2.0000 g | INTRAVENOUS | Status: AC
  Administered 2017-04-16: 2 g via INTRAVENOUS
  Filled 2017-04-16: qty 100

## 2017-04-16 MED ORDER — SIMETHICONE 80 MG PO CHEW: 40.0000 mg | CHEWABLE_TABLET | Freq: Four times a day (QID) | ORAL | Status: DC | PRN

## 2017-04-16 MED ORDER — MIDAZOLAM HCL 2 MG/2ML IJ SOLN
INTRAMUSCULAR | Status: AC
  Filled 2017-04-16: qty 2

## 2017-04-16 SURGICAL SUPPLY — 36 items
BAG HAMPER (MISCELLANEOUS) ×3 IMPLANT
CLOTH BEACON ORANGE TIMEOUT ST (SAFETY) ×3 IMPLANT
COVER LIGHT HANDLE STERIS (MISCELLANEOUS) ×6 IMPLANT
DERMABOND ADVANCED (GAUZE/BANDAGES/DRESSINGS) ×2
DERMABOND ADVANCED .7 DNX12 (GAUZE/BANDAGES/DRESSINGS) ×1 IMPLANT
DRAIN PENROSE 18X1/2 LTX STRL (DRAIN) ×3 IMPLANT
ELECT REM PT RETURN 9FT ADLT (ELECTROSURGICAL) ×3
ELECTRODE REM PT RTRN 9FT ADLT (ELECTROSURGICAL) ×1 IMPLANT
GLOVE BIO SURGEON STRL SZ 6.5 (GLOVE) ×2 IMPLANT
GLOVE BIO SURGEONS STRL SZ 6.5 (GLOVE) ×1
GLOVE BIOGEL PI IND STRL 6.5 (GLOVE) ×1 IMPLANT
GLOVE BIOGEL PI IND STRL 7.0 (GLOVE) ×3 IMPLANT
GLOVE BIOGEL PI INDICATOR 6.5 (GLOVE) ×2
GLOVE BIOGEL PI INDICATOR 7.0 (GLOVE) ×6
GOWN STRL REUS W/ TWL XL LVL3 (GOWN DISPOSABLE) ×1 IMPLANT
GOWN STRL REUS W/TWL LRG LVL3 (GOWN DISPOSABLE) ×6 IMPLANT
GOWN STRL REUS W/TWL XL LVL3 (GOWN DISPOSABLE) ×2
INST SET MINOR GENERAL (KITS) ×3 IMPLANT
KIT ROOM TURNOVER APOR (KITS) ×3 IMPLANT
MANIFOLD NEPTUNE II (INSTRUMENTS) ×3 IMPLANT
MESH HERNIA 1.6X1.9 PLUG LRG (Mesh General) ×1 IMPLANT
MESH HERNIA PLUG LRG (Mesh General) ×2 IMPLANT
MESH MARLEX PLUG MEDIUM (Mesh General) ×3 IMPLANT
NEEDLE HYPO 21X1.5 SAFETY (NEEDLE) ×3 IMPLANT
NS IRRIG 1000ML POUR BTL (IV SOLUTION) ×3 IMPLANT
PACK MINOR (CUSTOM PROCEDURE TRAY) ×3 IMPLANT
PAD ARMBOARD 7.5X6 YLW CONV (MISCELLANEOUS) ×3 IMPLANT
SET BASIN LINEN APH (SET/KITS/TRAYS/PACK) ×3 IMPLANT
SUT MNCRL AB 4-0 PS2 18 (SUTURE) ×6 IMPLANT
SUT NOVA NAB GS-22 2 2-0 T-19 (SUTURE) ×18 IMPLANT
SUT VIC AB 2-0 CT1 27 (SUTURE) ×4
SUT VIC AB 2-0 CT1 TAPERPNT 27 (SUTURE) ×2 IMPLANT
SUT VIC AB 3-0 SH 27 (SUTURE) ×4
SUT VIC AB 3-0 SH 27X BRD (SUTURE) ×2 IMPLANT
SUT VICRYL AB 3 0 TIES (SUTURE) ×3 IMPLANT
SYR 20CC LL (SYRINGE) ×3 IMPLANT

## 2017-04-16 NOTE — Progress Notes (Signed)
Dr. Constance Haw notified via telephone of patient ability to void freely clear yellow urine and patient verbalized "ready to go home". Per Dr. Constance Haw, cancel admission orders.

## 2017-04-16 NOTE — Anesthesia Procedure Notes (Addendum)
Procedure Name: Intubation Date/Time: 04/16/2017 10:40 AM Performed by: Vista Deck, CRNA Pre-anesthesia Checklist: Patient identified, Patient being monitored, Timeout performed, Emergency Drugs available and Suction available Patient Re-evaluated:Patient Re-evaluated prior to induction Oxygen Delivery Method: Circle System Utilized Preoxygenation: Pre-oxygenation with 100% oxygen Induction Type: IV induction Ventilation: Mask ventilation without difficulty Laryngoscope Size: Miller and 2 Grade View: Grade I Tube type: Oral Tube size: 7.0 mm Number of attempts: 1 Airway Equipment and Method: Stylet Placement Confirmation: ETT inserted through vocal cords under direct vision,  positive ETCO2 and breath sounds checked- equal and bilateral Secured at: 22 cm Tube secured with: Tape Dental Injury: Teeth and Oropharynx as per pre-operative assessment

## 2017-04-16 NOTE — Discharge Instructions (Signed)
Discharge Instructions: Shower per your regular routine. Take tylenol and ibuprofen as needed for pain control, alternating every 4-6 hours.  Take Roxicodone for breakthrough pain. Take colace for constipation related to narcotic pain medication. Do not pick at the dermabond glue on your incision sites.  Do not lift over 10-15 lbs for the next 6-8 weeks.    PATIENT INSTRUCTIONS HERNIA  FOLLOW-UP:  Please make an appointment with your physician in 1 week(s).  Call your physician immediately if you have any fevers greater than 102.5, drainage from you wound that is not clear or looks infected, persistent bleeding, increasing abdominal pain, problems urinating, or persistent nausea/vomiting.    WOUND CARE INSTRUCTIONS:  Keep a dry clean dressing on the wound if there is drainage.   If clothing rubs against the wound or causes irritation and the wound is not draining you may cover it with a dry dressing during the daytime.  Try to keep the wound dry and avoid ointments on the wound unless directed to do so.  If the wound becomes bright red and painful or starts to drain infected material that is not clear, please contact your physician immediately.  If the wound is mildly pink and has a thick firm ridge underneath it, this is normal, and is referred to as a healing ridge.  This will resolve over the next 4-6 weeks.  DIET:  You may eat any foods that you can tolerate.  It is a good idea to eat a high fiber diet and take in plenty of fluids to prevent constipation.  If you do become constipated you may want to take a mild laxative or take ducolax tablets on a daily basis until your bowel habits are regular.  Constipation can be very uncomfortable, along with straining, after recent abdominal surgery.  ACTIVITY:  You are encouraged to cough and deep breath or use your incentive spirometer if you were given one, every 15-30 minutes when awake.  This will help prevent respiratory complications and low  grade fevers post-operatively.  You may want to hug a pillow when coughing and sneezing to add additional support to the surgical area which will decrease pain during these times.  You are encouraged to walk and engage in light activity for the next two weeks.  You should not lift more than 10 pounds during this time frame as it could put you at increased risk for a hernia recurrence.   MEDICATIONS:  Try to take narcotic medications and anti-inflammatory medications, such as tylenol, ibuprofen, naprosyn, etc., with food.  This will minimize stomach upset from the medication.  Should you develop nausea and vomiting from the pain medication, or develop a rash, please discontinue the medication and contact your physician.  You should not drive, make important decisions, or operate machinery when taking narcotic pain medication.  QUESTIONS:  Please feel free to call your physician or the hospital operator if you have any questions, and they will be glad to assist you.    PATIENT INSTRUCTIONS POST-ANESTHESIA  IMMEDIATELY FOLLOWING SURGERY:  Do not drive or operate machinery for the first twenty four hours after surgery.  Do not make any important decisions for twenty four hours after surgery or while taking narcotic pain medications or sedatives.  If you develop intractable nausea and vomiting or a severe headache please notify your doctor immediately.  FOLLOW-UP:  Please make an appointment with your surgeon as instructed. You do not need to follow up with anesthesia unless specifically instructed to do  so.  WOUND CARE INSTRUCTIONS (if applicable):  Keep a dry clean dressing on the anesthesia/puncture wound site if there is drainage.  Once the wound has quit draining you may leave it open to air.  Generally you should leave the bandage intact for twenty four hours unless there is drainage.  If the epidural site drains for more than 36-48 hours please call the anesthesia department.  QUESTIONS?:  Please  feel free to call your physician or the hospital operator if you have any questions, and they will be happy to assist you.

## 2017-04-16 NOTE — Progress Notes (Signed)
Dr. Constance Haw notified via telephone, patient unable to void.Discussed with patient Dr. Constance Haw recommends voiding before discharge. Patient unable to void after multiple attempts.  Patient verbally agrees to be admitted for observation. Per Dr. Constance Haw, if patient voids later this evening, he can be discharged.Patient verbalized understanding.

## 2017-04-16 NOTE — Transfer of Care (Signed)
Immediate Anesthesia Transfer of Care Note  Patient: Corey Cruz  Procedure(s) Performed: HERNIA REPAIR INGUINAL ADULT WITH MESH (Bilateral Abdomen)  Patient Location: PACU  Anesthesia Type:General  Level of Consciousness: awake and patient cooperative  Airway & Oxygen Therapy: Patient Spontanous Breathing and non-rebreather face mask  Post-op Assessment: Report given to RN and Post -op Vital signs reviewed and stable  Post vital signs: Reviewed and stable  Last Vitals:  Vitals:   04/16/17 0925 04/16/17 0930  BP: 121/81 112/76  Pulse:    Resp: 15 18  Temp:    SpO2: 96% 95%    Last Pain:  Vitals:   04/16/17 0836  TempSrc: Oral      Patients Stated Pain Goal: 5 (91/22/58 3462)  Complications: No apparent anesthesia complications

## 2017-04-16 NOTE — Anesthesia Postprocedure Evaluation (Signed)
Anesthesia Post Note  Patient: Corey Cruz  Procedure(s) Performed: HERNIA REPAIR INGUINAL ADULT WITH MESH (Bilateral Abdomen)  Patient location during evaluation: PACU Anesthesia Type: General Level of consciousness: awake and alert Pain management: satisfactory to patient Vital Signs Assessment: post-procedure vital signs reviewed and stable Respiratory status: spontaneous breathing Cardiovascular status: stable Postop Assessment: no apparent nausea or vomiting Anesthetic complications: no     Last Vitals:  Vitals:   04/16/17 1330 04/16/17 1350  BP: 110/76 (!) 103/57  Pulse: 60 87  Resp: 13 16  Temp:  36.6 C  SpO2: 91% 94%    Last Pain:  Vitals:   04/16/17 1350  TempSrc: Oral  PainSc: 6                  [facility] Kyler

## 2017-04-16 NOTE — Progress Notes (Signed)
[facility]  Unable to void. Not unexpected. Will observe and if voids later tonight can d/c home. May need I&O cath if unable to void.  PRN and diet ordered.   Curlene Labrum, MD [facility] [address] [place], [place] [address] [phone] (office)

## 2017-04-16 NOTE — Op Note (Signed)
[facility] Operative Note  04/16/17  Preoperative Diagnosis: Bilateral inguinal hernias   Postoperative Diagnosis: Bilateral inguinal hernias and bilateral cord lipomas   Procedure(s) Performed: Right inguinal hernia repair with mesh plug and patch; Left inguinal hernia repair with mesh patch    Surgeon: Lanell Matar. Constance Haw, MD   Assistants: Aviva Signs, MD    Anesthesia: General endotracheal   Anesthesiologist: Lerry Liner, MD    Specimens: None   Estimated Blood Loss: 10cc    Blood Replacement: None    Complications: None   Wound Class: Clean    Operative Indications: Mr. Goodson is a 46 yo active man who presented with a right inguinal hernia that has been enlarging and causing discomfort. He underwent a Ct scan by his PCP and was found to also have a left inguinal hernia that was evident on exam.  The patient is active and has an active job.  He would like to proceed with getting both hernias repaired, and we discussed the differences in laparoscopic and open inguinal hernia repair with mesh.  We also discussed the risk and benefits of the surgery including bleeding, infection, mesh use, recurrence risk, nerve damage/ numbness or sensation changes, risk of damage to the testicular structures.  After a thorough discussion, he opted to proceed.   Findings: Right inguinal hernia with large indirect defect, and large lipoma on cord; Left inguinal hernia with cord lipoma    Procedure: The patient was taken to the operating room and placed supine. General endotracheal anesthesia was induced. Intravenous antibiotics were administered per protocol.  A time out was preformed verifying the correct patient, procedure, site, positioning and implants.  The left and right groin and scrotum were prepared and draped in the usual sterile fashion.   An incision was made in a natural skin crease between the pubic tubercle and the anterior superior iliac spine on the right  side.  The incision was deepened with electrocautery through Scarpa's and Camper's fascia until the aponeurosis of the external oblique was encountered.  This was cleaned and the external ring was exposed.  An incision was made in the midportion of the external oblique aponeurosis in the direction of its fibers. The ilioinguinal nerve was identified and was protected throughout the dissection.  Flaps of the external oblique were developed cephalad and inferiorly.    The cord was identified and it was gently dissented free at the pubic tubercle and encircled with a Penrose drain.  Attention was then directed at the anteromedial aspect of the cord, where an indirect hernia sac was identified.  The sac was carefully dissected free from the cord down to the level of the internal ring.  There as a large cord lipoma noted. The vas and testicular vessels were identified and protected from harm.  Once the sac was dissected free from the cords, the Penrose was placed around the cord which was retracted inferiorly out of the field of view.  The hernia was reduced into the internal ring without difficulty.  A Perfix Plug was placed into the defect and filled the space.  The cord lipoma was carefully dissected off the cord, and suture ligated at the base and removed.  Attention was then turned to the floor of the canal, which was grossly weakened without any defined defect or sac.  The Perfix Mesh Patch was sutured to the inguinal ligament inferiorly starting at the pubic tubercle using 2-0 Novafil interrupted sutures.  The mesh was sutured superiorly to the conjoint tendon  using 2-0 Novafil interrupted sutures.  Care was taken to ensure the mesh was placed in a relaxed fashion to avoid excessive tension and no neurovascular structures were caught in the repair.  Laterally the tails of the mesh were crossed and the internal ring was recreated, allowing for passage of cords without tension.   Hemostasis was adequate.  The  Penrose was removed.  The external oblique aponeurosis was closed with a 2-0 Vicryl suture in a running fashion, taking care to not catch the ilioinguinal nerve in the suture line.  Scarpa's fashion was closed with 3-0 Vicryl interrupted sutures. The skin was closed with a subcuticular 4-0 Monocryl suture.   Next attention was turned to the left groin, and an incision was made in a natural skin crease between the pubic tubercle and the anterior superior iliac spine on the left side.  The incision was deepened with electrocautery through Scarpa's and Camper's fascia until the aponeurosis of the external oblique was encountered.  This was cleaned and the external ring was exposed.  An incision was made in the midportion of the external oblique aponeurosis in the direction of its fibers. The ilioinguinal nerve was identified and was protected throughout the dissection.  Flaps of the external oblique were developed cephalad and inferiorly.    The cord was identified and it was gently dissented free at the pubic tubercle and encircled with a Penrose drain.  Attention was then directed at the anteromedial aspect of the cord, where an indirect hernia sac was identified.  The sac was carefully dissected free from the cord down to the level of the internal ring.  There as a cord lipoma noted. The vas and testicular vessels were identified and protected from harm.  Once the sac was dissected free from the cords, the Penrose was placed around the cord which was retracted inferiorly out of the field of view.  There was no obvious hernia sac but instead the cord lipoma throughout the length of the cord.  This was dissected off the cord carefully, and ligated at the base, and the small hernia defect through which the cord structures originated was not large enough to require a Plug.  Attention was then turned to the floor of the canal, which was grossly weakened without any defined defect or sac.  The Perfix Mesh Patch was  sutured to the inguinal ligament inferiorly starting at the pubic tubercle using 2-0 Novafil interrupted sutures.  The mesh was sutured superiorly to the conjoint tendon using 2-0 Novafil interrupted sutures.  Care was taken to ensure the mesh was placed in a relaxed fashion to avoid excessive tension and no neurovascular structures were caught in the repair.  Laterally the tails of the mesh were crossed and the internal ring was recreated, allowing for passage of cords without tension.   Hemostasis was adequate.  The Penrose was removed.  The external oblique aponeurosis was closed with a 2-0 Vicryl suture in a running fashion, taking care to not catch the ilioinguinal nerve in the suture line.  Scarpa's fashion was closed with 3-0 Vicryl interrupted sutures. The skin was closed with a subcuticular 4-0 Monocryl suture.  Both incisions were infiltrated with Xparel anesthetic. The skin incisions were cleaned and closed with dermabond.    The testis was gently pulled down into its anatomic position in the scrotum.  The patient tolerated the procedure well and was taken to the PACU in stable condition. The peritoneum was not entered.   Curlene Labrum, MD [facility] [address] [place], [place] [address] [phone] (office)

## 2017-04-16 NOTE — Anesthesia Preprocedure Evaluation (Signed)
Anesthesia Evaluation  Patient identified by MRN, date of birth, ID band Patient awake    Reviewed: Allergy & Precautions, NPO status , Patient's Chart, lab work & pertinent test results  Airway Mallampati: I  TM Distance: >3 FB Neck ROM: Full    Dental  (+) Teeth Intact   Pulmonary neg pulmonary ROS,    breath sounds clear to auscultation       Cardiovascular hypertension, Pt. on medications  Rhythm:Regular Rate:Normal     Neuro/Psych    GI/Hepatic negative GI ROS, Neg liver ROS,   Endo/Other  diabetes, Type 2, Oral Hypoglycemic Agents  Renal/GU negative Renal ROS     Musculoskeletal  (+) Arthritis , Lumbar HNP   Abdominal   Peds  Hematology   Anesthesia Other Findings   Reproductive/Obstetrics                             Anesthesia Physical Anesthesia Plan  ASA: II  Anesthesia Plan: General   Post-op Pain Management:    Induction: Intravenous  PONV Risk Score and Plan:   Airway Management Planned: Oral ETT  Additional Equipment:   Intra-op Plan:   Post-operative Plan: Extubation in OR  Informed Consent: I have reviewed the patients History and Physical, chart, labs and discussed the procedure including the risks, benefits and alternatives for the proposed anesthesia with the patient or authorized representative who has indicated his/her understanding and acceptance.     Plan Discussed with:   Anesthesia Plan Comments:         Anesthesia Quick Evaluation

## 2017-04-16 NOTE — Interval H&P Note (Signed)
History and Physical Interval Note:  04/16/2017 8:44 AM  Corey Cruz  has presented today for surgery, with the diagnosis of bilateral inguinal hernias  The various methods of treatment have been discussed with the patient and family. After consideration of risks, benefits and other options for treatment, the patient has consented to  Procedure(s): HERNIA REPAIR INGUINAL ADULT WITH MESH (Bilateral) as a surgical intervention .  The patient's history has been reviewed, patient examined, no change in status, stable for surgery.  I have reviewed the patient's chart and labs.  Questions were answered to the patient's satisfaction.    No questions and no major changes.  Virl Cagey

## 2017-04-17 NOTE — Discharge Summary (Signed)
Discharge Summary Note  Patient was going to be brought in for observation post operatively due to inability to void in PACU. Patient then voided after the orders were placed. He went home from PACU and was never brought into the hospital for observation or admission and remained an outpatient.  Curlene Labrum, MD [facility] [address] [place], [place] [address] [phone] (office)

## 2017-04-18 ENCOUNTER — Encounter (HOSPITAL_COMMUNITY): Payer: Self-pay | Admitting: General Surgery

## 2017-04-24 ENCOUNTER — Ambulatory Visit (INDEPENDENT_AMBULATORY_CARE_PROVIDER_SITE_OTHER): Payer: Self-pay | Admitting: General Surgery

## 2017-04-24 ENCOUNTER — Encounter: Payer: Self-pay | Admitting: General Surgery

## 2017-04-24 VITALS — BP 116/71 | HR 94 | Temp 98.2°F | Resp 18 | Ht 71.0 in | Wt 211.0 lb

## 2017-04-24 DIAGNOSIS — K4090 Unilateral inguinal hernia, without obstruction or gangrene, not specified as recurrent: Secondary | ICD-10-CM

## 2017-04-24 NOTE — Progress Notes (Signed)
[facility] Note   HPI:  46 y.o. Male presents to clinic for post-op follow-up evaluation after bilateral inguinal hernia repair with mesh. He is doing well. He is voiding normally. He is somewhat tender but overall good.  Review of Systems:  No fevers or chills Healing All other review of systems: otherwise negative   Vital Signs:  BP 116/71   Pulse 94   Temp 98.2 F (36.8 C)   Resp 18   Ht 5\' 11"  (1.803 m)   Wt 211 lb (95.7 kg)   BMI 29.43 kg/m    Physical Exam:  Physical Exam  Constitutional: He is well-developed, well-nourished, and in no distress.  Cardiovascular: Normal rate.  Abdominal: Soft. He exhibits no distension. There is no tenderness.  Healing bilateral inguinal incisions, with dermabond, no erythema or drainage, some induration  Vitals reviewed.   Laboratory studies: None   Imaging:  None   Assessment:  46 y.o. yo Male s/p bilateral inguinal hernia repair with mesh. Doing well. Had some issues in PACU with voiding but resolved and went home.   Plan:  - Follow up first of January for recheck to see if can return to work on light duty with lifting restrictions     Curlene Labrum, MD [facility] [address] [place], [place] [address] [phone] (office)

## 2017-04-26 ENCOUNTER — Other Ambulatory Visit: Payer: Self-pay | Admitting: Family

## 2017-04-26 DIAGNOSIS — E785 Hyperlipidemia, unspecified: Secondary | ICD-10-CM

## 2017-04-26 DIAGNOSIS — G8929 Other chronic pain: Secondary | ICD-10-CM

## 2017-04-26 DIAGNOSIS — M545 Low back pain, unspecified: Secondary | ICD-10-CM

## 2017-04-26 DIAGNOSIS — E119 Type 2 diabetes mellitus without complications: Secondary | ICD-10-CM

## 2017-04-30 NOTE — Telephone Encounter (Signed)
Last seen 01/30/17  55.3

## 2017-05-08 ENCOUNTER — Ambulatory Visit (INDEPENDENT_AMBULATORY_CARE_PROVIDER_SITE_OTHER): Payer: Self-pay | Admitting: General Surgery

## 2017-05-08 VITALS — BP 111/81 | HR 81 | Temp 98.9°F | Resp 18 | Ht 71.0 in | Wt 212.0 lb

## 2017-05-08 DIAGNOSIS — Z9889 Other specified postprocedural states: Secondary | ICD-10-CM

## 2017-05-08 DIAGNOSIS — Z8719 Personal history of other diseases of the digestive system: Secondary | ICD-10-CM

## 2017-05-09 ENCOUNTER — Encounter: Payer: Self-pay | Admitting: General Surgery

## 2017-05-09 NOTE — Progress Notes (Signed)
[facility] Note   HPI:  47 y.o. Male presents to clinic for evaluation after bilateral inguinal hernia repair on 04/16/2017.  He has been doing well and is feeling good. He says that the tenderness has improved, and that he feels back to his normal self. He has been having regular BMs. 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   Review of Systems:  No fevers or chills  No drainage or redness at incisions  All other review of systems: otherwise negative   Vital Signs:  BP 111/81   Pulse 81   Temp 98.9 F (37.2 C)   Resp 18   Ht 5\' 11"  (1.803 m)   Wt 212 lb (96.2 kg)   BMI 29.57 kg/m    Physical Exam:  Physical Exam  Constitutional: He is well-developed, well-nourished, and in no distress.  HENT:  Head: Normocephalic.  Cardiovascular: Normal rate and regular rhythm.  Pulmonary/Chest: Effort normal.  Abdominal: Soft. He exhibits no distension. There is no tenderness.  Bilateral inguinal incisions healing, dermabond in place but peeling some, no erythema or drainage, mild induration  Musculoskeletal: Normal range of motion.  Vitals reviewed.   Laboratory studies: None  Imaging:  None   Assessment:  47 y.o. yo Male s/p a bilateral inguinal hernia repair with mesh. We have discussed that 6-8 weeks of no lifting greater than 10 lbs is recommended for a hernia repair to allow for healing of the repair and scarring in of the mesh. He understands this and wants to make sure his repairs last, reporting "I am not going to do anything to put myself in danger."  He has set aside money for the procedure and life expenses during the 4 weeks he has been off, and really needs to go back to work.  His work involves doing Dealer work, Investment banker, operational to [employer], and occasionally he might need to lift something that would be ~20lbs. He reports that he will get friends/ coworkers to help him with this lifting need.  We have  discussed that in the best case scenario he would refrain from all lifting over 10 lbs, but that life and reality also are a part of the equation, and I cannot in good conscious keep him from working if he needs to financially support his family.  He knows that there is an increase risk of recurrence if he lifts and over extends himself and assures me that he will not doing this in the upcoming week.s   Plan:  - Back to work after 05/12/2016 without restrictions (since his work has no options for restrictions at his job) with patient understanding that he needs to refrain from lifting >10lbs  - Increase exercising/ cardio and lifting weights slowly after mid February  - PRN follow up   All of the above recommendations were discussed with the patient and patient's family, and all of patient's and family's questions were answered to their expressed satisfaction.  Curlene Labrum, MD [facility] [address] [place],  [address] [phone] (office)

## 2017-07-29 ENCOUNTER — Encounter: Payer: Self-pay | Admitting: Family

## 2017-07-29 ENCOUNTER — Ambulatory Visit: Payer: Managed Care, Other (non HMO) | Admitting: Family

## 2017-07-29 VITALS — BP 137/85 | HR 99 | Temp 97.5°F | Ht 71.0 in | Wt 201.6 lb

## 2017-07-29 DIAGNOSIS — E1159 Type 2 diabetes mellitus with other circulatory complications: Secondary | ICD-10-CM | POA: Diagnosis not present

## 2017-07-29 DIAGNOSIS — E1169 Type 2 diabetes mellitus with other specified complication: Secondary | ICD-10-CM

## 2017-07-29 DIAGNOSIS — E559 Vitamin D deficiency, unspecified: Secondary | ICD-10-CM | POA: Diagnosis not present

## 2017-07-29 DIAGNOSIS — I10 Essential (primary) hypertension: Secondary | ICD-10-CM | POA: Diagnosis not present

## 2017-07-29 DIAGNOSIS — M545 Low back pain, unspecified: Secondary | ICD-10-CM

## 2017-07-29 DIAGNOSIS — E663 Overweight: Secondary | ICD-10-CM | POA: Diagnosis not present

## 2017-07-29 DIAGNOSIS — G8929 Other chronic pain: Secondary | ICD-10-CM

## 2017-07-29 DIAGNOSIS — I152 Hypertension secondary to endocrine disorders: Secondary | ICD-10-CM

## 2017-07-29 DIAGNOSIS — E785 Hyperlipidemia, unspecified: Secondary | ICD-10-CM

## 2017-07-29 DIAGNOSIS — E119 Type 2 diabetes mellitus without complications: Secondary | ICD-10-CM | POA: Diagnosis not present

## 2017-07-29 DIAGNOSIS — N529 Male erectile dysfunction, unspecified: Secondary | ICD-10-CM

## 2017-07-29 LAB — BAYER DCA HB A1C WAIVED: HB A1C: 5.5 % (ref <7.0)

## 2017-07-29 MED ORDER — SILDENAFIL CITRATE 100 MG PO TABS: 50.0000 mg | ORAL_TABLET | Freq: Every day | ORAL | 11 refills | Status: DC | PRN

## 2017-07-29 NOTE — Progress Notes (Signed)
Subjective:    Patient ID: Corey Cruz, male    DOB: 01/31/1971, 47 y.o.   MRN: [id number]  PT presents to the office today for chronic follow up.  Diabetes  He presents for his follow-up diabetic visit. He has type 2 diabetes mellitus. His disease course has been stable. There are no hypoglycemic associated symptoms. Pertinent negatives for diabetes include no blurred vision, no foot paresthesias and no visual change. There are no hypoglycemic complications. Symptoms are stable. Pertinent negatives for diabetic complications include no CVA, heart disease, nephropathy or peripheral neuropathy. Risk factors for coronary artery disease include diabetes mellitus, dyslipidemia, male sex, hypertension and sedentary lifestyle. He is following a generally healthy diet. His overall blood glucose range is 110-130 mg/dl. Eye exam is current.  Hyperlipidemia  This is a chronic problem. The current episode started more than 1 year ago. The problem is controlled. Recent lipid tests were reviewed and are normal. Associated symptoms include leg pain. Pertinent negatives include no shortness of breath. Current antihyperlipidemic treatment includes statins. The current treatment provides moderate improvement of lipids. Risk factors for coronary artery disease include dyslipidemia, diabetes mellitus, male sex and hypertension.  Hypertension  This is a chronic problem. The current episode started more than 1 year ago. The problem has been resolved since onset. The problem is controlled. Pertinent negatives include no blurred vision, malaise/fatigue, peripheral edema or shortness of breath. Risk factors for coronary artery disease include diabetes mellitus, dyslipidemia and male gender. The current treatment provides moderate improvement. There is no history of kidney disease, CAD/MI or CVA.  Back Pain  This is a chronic problem. The problem occurs intermittently. The problem has been waxing and waning since  onset. The pain is present in the lumbar spine. The quality of the pain is described as aching. The pain is at a severity of 2/10. The pain is moderate. Associated symptoms include leg pain.  ED PT takes Viagra as needed. Stable.    Review of Systems  Constitutional: Negative for malaise/fatigue.  Eyes: Negative for blurred vision.  Respiratory: Negative for shortness of breath.   Musculoskeletal: Positive for back pain.  All other systems reviewed and are negative.      Objective:   Physical Exam  Constitutional: He is oriented to person, place, and time. He appears well-developed and well-nourished. No distress.  HENT:  Head: Normocephalic.  Right Ear: External ear normal.  Left Ear: External ear normal.  Nose: Nose normal.  Mouth/Throat: Oropharynx is clear and moist.  Eyes: Pupils are equal, round, and reactive to light. Right eye exhibits no discharge. Left eye exhibits no discharge.  Neck: Normal range of motion. Neck supple. No thyromegaly present.  Cardiovascular: Normal rate, regular rhythm, normal heart sounds and intact distal pulses.  No murmur heard. Pulmonary/Chest: Effort normal and breath sounds normal. No respiratory distress. He has no wheezes.  Abdominal: Soft. Bowel sounds are normal. He exhibits no distension. There is no tenderness.  Musculoskeletal: Normal range of motion. He exhibits no edema or tenderness.  Neurological: He is alert and oriented to person, place, and time.  Skin: Skin is warm and dry. No rash noted. No erythema.  Psychiatric: He has a normal mood and affect. His behavior is normal. Judgment and thought content normal.  Vitals reviewed.    BP 137/85   Pulse 99   Temp (!) 97.5 F (36.4 C) (Oral)   Ht '5\' 11"'  (1.803 m)   Wt 201 lb 9.6 oz (91.4 kg)  BMI 28.12 kg/m      Assessment & Plan:  1. Hypertension associated with diabetes ([place]) - CMP14+EGFR  2. Hyperlipidemia associated with type 2 diabetes mellitus ([place]) -  CMP14+EGFR  3. Overweight (BMI 25.0-29.9) - CMP14+EGFR  4. Vitamin D deficiency - CMP14+EGFR  5. Chronic bilateral low back pain without sciatica - CMP14+EGFR  6. Diabetes mellitus without complication ([facility]) - Bayer DCA Hb A1c Waived - CMP14+EGFR - Microalbumin / creatinine urine ratio  7. Erectile dysfunction, unspecified erectile dysfunction type  - sildenafil (VIAGRA) 100 MG tablet; Take 0.5-1 tablets (50-100 mg total) by mouth daily as needed for erectile dysfunction.  Dispense: 5 tablet; Refill: 11   Continue all meds Labs pending Health Maintenance reviewed Diet and exercise encouraged RTO 6 months    Evelina Dun, FNP

## 2017-07-29 NOTE — Patient Instructions (Signed)

## 2017-07-30 ENCOUNTER — Telehealth: Payer: Self-pay | Admitting: Family

## 2017-07-30 ENCOUNTER — Other Ambulatory Visit: Payer: Self-pay | Admitting: Family

## 2017-07-30 DIAGNOSIS — E119 Type 2 diabetes mellitus without complications: Secondary | ICD-10-CM

## 2017-07-30 DIAGNOSIS — N529 Male erectile dysfunction, unspecified: Secondary | ICD-10-CM

## 2017-07-30 DIAGNOSIS — E785 Hyperlipidemia, unspecified: Secondary | ICD-10-CM

## 2017-07-30 DIAGNOSIS — M545 Low back pain: Principal | ICD-10-CM

## 2017-07-30 DIAGNOSIS — I10 Essential (primary) hypertension: Secondary | ICD-10-CM

## 2017-07-30 DIAGNOSIS — G8929 Other chronic pain: Secondary | ICD-10-CM

## 2017-07-30 LAB — CMP14+EGFR
ALBUMIN: 4.7 g/dL (ref 3.5–5.5)
ALT: 18 IU/L (ref 0–44)
AST: 13 IU/L (ref 0–40)
Albumin/Globulin Ratio: 2 (ref 1.2–2.2)
Alkaline Phosphatase: 92 IU/L (ref 39–117)
BUN / CREAT RATIO: 25 — AB (ref 9–20)
BUN: 20 mg/dL (ref 6–24)
Bilirubin Total: 0.4 mg/dL (ref 0.0–1.2)
CALCIUM: 9.2 mg/dL (ref 8.7–10.2)
CO2: 20 mmol/L (ref 20–29)
Chloride: 104 mmol/L (ref 96–106)
Creatinine, Ser: 0.8 mg/dL (ref 0.76–1.27)
GFR, EST AFRICAN AMERICAN: 122 mL/min/{1.73_m2} (ref >59)
GFR, EST NON AFRICAN AMERICAN: 106 mL/min/{1.73_m2} (ref >59)
GLOBULIN, TOTAL: 2.3 g/dL (ref 1.5–4.5)
Glucose: 117 mg/dL — ABNORMAL HIGH (ref 65–99)
Potassium: 4.3 mmol/L (ref 3.5–5.2)
SODIUM: 138 mmol/L (ref 134–144)
TOTAL PROTEIN: 7 g/dL (ref 6.0–8.5)

## 2017-07-30 LAB — MICROALBUMIN / CREATININE URINE RATIO
Creatinine, Urine: 88.3 mg/dL
MICROALBUM., U, RANDOM: 3.8 ug/mL
Microalb/Creat Ratio: 4.3 mg/g creat (ref 0.0–30.0)

## 2017-07-30 NOTE — Telephone Encounter (Signed)
Please review and advise.

## 2017-07-31 MED ORDER — VITAMIN D (ERGOCALCIFEROL) 1.25 MG (50000 UNIT) PO CAPS: 50000.0000 [IU] | ORAL_CAPSULE | ORAL | 1 refills | Status: DC

## 2017-07-31 MED ORDER — LISINOPRIL 10 MG PO TABS: 10.0000 mg | ORAL_TABLET | Freq: Every day | ORAL | 1 refills | Status: DC

## 2017-07-31 MED ORDER — MELOXICAM 15 MG PO TABS: 15.0000 mg | ORAL_TABLET | Freq: Every day | ORAL | 1 refills | Status: DC

## 2017-07-31 MED ORDER — METFORMIN HCL 1000 MG PO TABS: 1000.0000 mg | ORAL_TABLET | Freq: Two times a day (BID) | ORAL | 1 refills | Status: DC

## 2017-07-31 MED ORDER — ATORVASTATIN CALCIUM 20 MG PO TABS: 20.0000 mg | ORAL_TABLET | Freq: Every day | ORAL | 1 refills | Status: DC

## 2017-07-31 NOTE — Telephone Encounter (Signed)
Prescription sent to pharmacy.

## 2017-08-04 ENCOUNTER — Telehealth: Payer: Self-pay | Admitting: Family

## 2017-08-04 DIAGNOSIS — N529 Male erectile dysfunction, unspecified: Secondary | ICD-10-CM

## 2017-08-04 MED ORDER — SILDENAFIL CITRATE 100 MG PO TABS: 50.0000 mg | ORAL_TABLET | Freq: Every day | ORAL | 11 refills | Status: DC | PRN

## 2017-08-04 NOTE — Telephone Encounter (Signed)
Prescription sent to pharmacy.

## 2017-10-23 ENCOUNTER — Other Ambulatory Visit: Payer: Self-pay | Admitting: Family

## 2018-01-21 ENCOUNTER — Other Ambulatory Visit: Payer: Self-pay | Admitting: Family

## 2018-01-22 NOTE — Telephone Encounter (Signed)
Last seen 07/29/17

## 2018-02-02 ENCOUNTER — Encounter: Payer: Self-pay | Admitting: Family

## 2018-02-02 ENCOUNTER — Ambulatory Visit (INDEPENDENT_AMBULATORY_CARE_PROVIDER_SITE_OTHER): Payer: Managed Care, Other (non HMO) | Admitting: Family

## 2018-02-02 VITALS — BP 132/85 | HR 67 | Temp 97.4°F | Ht 71.0 in | Wt 204.2 lb

## 2018-02-02 DIAGNOSIS — Z0000 Encounter for general adult medical examination without abnormal findings: Secondary | ICD-10-CM

## 2018-02-02 DIAGNOSIS — B351 Tinea unguium: Secondary | ICD-10-CM

## 2018-02-02 DIAGNOSIS — M545 Low back pain: Secondary | ICD-10-CM

## 2018-02-02 DIAGNOSIS — E785 Hyperlipidemia, unspecified: Secondary | ICD-10-CM

## 2018-02-02 DIAGNOSIS — E1159 Type 2 diabetes mellitus with other circulatory complications: Secondary | ICD-10-CM

## 2018-02-02 DIAGNOSIS — I10 Essential (primary) hypertension: Secondary | ICD-10-CM

## 2018-02-02 DIAGNOSIS — E559 Vitamin D deficiency, unspecified: Secondary | ICD-10-CM

## 2018-02-02 DIAGNOSIS — E1169 Type 2 diabetes mellitus with other specified complication: Secondary | ICD-10-CM

## 2018-02-02 DIAGNOSIS — E119 Type 2 diabetes mellitus without complications: Secondary | ICD-10-CM

## 2018-02-02 DIAGNOSIS — E663 Overweight: Secondary | ICD-10-CM

## 2018-02-02 DIAGNOSIS — G8929 Other chronic pain: Secondary | ICD-10-CM

## 2018-02-02 LAB — BAYER DCA HB A1C WAIVED: HB A1C (BAYER DCA - WAIVED): 5.7 % (ref <7.0)

## 2018-02-02 MED ORDER — MELOXICAM 15 MG PO TABS: 15.0000 mg | ORAL_TABLET | Freq: Every day | ORAL | 2 refills | Status: DC

## 2018-02-02 MED ORDER — LISINOPRIL 10 MG PO TABS: 10.0000 mg | ORAL_TABLET | Freq: Every day | ORAL | 2 refills | Status: DC

## 2018-02-02 MED ORDER — EMPAGLIFLOZIN 10 MG PO TABS: 10.0000 mg | ORAL_TABLET | Freq: Every day | ORAL | 2 refills | Status: DC

## 2018-02-02 MED ORDER — ATORVASTATIN CALCIUM 20 MG PO TABS: 20.0000 mg | ORAL_TABLET | Freq: Every day | ORAL | 2 refills | Status: DC

## 2018-02-02 MED ORDER — TERBINAFINE HCL 250 MG PO TABS: 250.0000 mg | ORAL_TABLET | Freq: Every day | ORAL | 1 refills | Status: DC

## 2018-02-02 MED ORDER — METFORMIN HCL 1000 MG PO TABS: 1000.0000 mg | ORAL_TABLET | Freq: Two times a day (BID) | ORAL | 2 refills | Status: DC

## 2018-02-02 NOTE — Progress Notes (Signed)
Subjective:    Patient ID: Corey Cruz, male    DOB: [birth year], 47 y.o.   MRN: [id number]  Chief Complaint  Patient presents with  . Annual Exam   Pt presents to the office today for CPE.  Diabetes  He presents for his follow-up diabetic visit. He has type 2 diabetes mellitus. There are no hypoglycemic associated symptoms. Pertinent negatives for diabetes include no blurred vision, no foot paresthesias and no visual change. There are no hypoglycemic complications. Symptoms are stable. Pertinent negatives for diabetic complications include no CVA, heart disease, nephropathy or peripheral neuropathy. Risk factors for coronary artery disease include dyslipidemia, diabetes mellitus, male sex, hypertension and sedentary lifestyle. His overall blood glucose range is 110-130 mg/dl. Eye exam is not current.  Hypertension  This is a chronic problem. The current episode started more than 1 year ago. The problem has been waxing and waning since onset. Pertinent negatives include no blurred vision, malaise/fatigue, peripheral edema or shortness of breath. Risk factors for coronary artery disease include dyslipidemia, diabetes mellitus and obesity. The current treatment provides moderate improvement. There is no history of kidney disease, CAD/MI or CVA.  Hyperlipidemia  This is a chronic problem. The current episode started more than 1 year ago. The problem is controlled. Recent lipid tests were reviewed and are normal. Pertinent negatives include no shortness of breath. Current antihyperlipidemic treatment includes statins. The current treatment provides moderate improvement of lipids. Risk factors for coronary artery disease include dyslipidemia, diabetes mellitus, male sex and hypertension.  Back Pain  This is a chronic problem. The current episode started more than 1 year ago. The problem occurs intermittently. The problem has been waxing and waning since onset. The pain is at a severity of 0/10  (at this time). The patient is experiencing no pain.      Review of Systems  Constitutional: Negative for malaise/fatigue.  Eyes: Negative for blurred vision.  Respiratory: Negative for shortness of breath.   Musculoskeletal: Positive for back pain.  All other systems reviewed and are negative.  Family History  Problem Relation Age of Onset  . Cancer Mother        lung  . Cancer Brother        leukemia   Social History   Socioeconomic History  . Marital status: Single    Spouse name: Not on file  . Number of children: Not on file  . Years of education: Not on file  . Highest education level: Not on file  Occupational History  . Not on file  Social Needs  . Financial resource strain: Not on file  . Food insecurity:    Worry: Not on file    Inability: Not on file  . Transportation needs:    Medical: Not on file    Non-medical: Not on file  Tobacco Use  . Smoking status: Never Smoker  . Smokeless tobacco: Never Used  Substance and Sexual Activity  . Alcohol use: Yes    Alcohol/week: 1.0 standard drinks    Types: 1 Shots of liquor per week  . Drug use: No  . Sexual activity: Yes    Birth control/protection: None  Lifestyle  . Physical activity:    Days per week: Not on file    Minutes per session: Not on file  . Stress: Not on file  Relationships  . Social connections:    Talks on phone: Not on file    Gets together: Not on file    Attends religious  service: Not on file    Active member of club or organization: Not on file    Attends meetings of clubs or organizations: Not on file    Relationship status: Not on file  Other Topics Concern  . Not on file  Social History Narrative  . Not on file        Objective:   Physical Exam  Constitutional: He is oriented to person, place, and time. He appears well-developed and well-nourished. No distress.  HENT:  Head: Normocephalic.  Right Ear: External ear normal.  Left Ear: External ear normal.    Mouth/Throat: Oropharynx is clear and moist.  Eyes: Pupils are equal, round, and reactive to light. Right eye exhibits no discharge. Left eye exhibits no discharge.  Neck: Normal range of motion. Neck supple. No thyromegaly present.  Cardiovascular: Normal rate, regular rhythm, normal heart sounds and intact distal pulses.  No murmur heard. Pulmonary/Chest: Effort normal and breath sounds normal. No respiratory distress. He has no wheezes.  Abdominal: Soft. Bowel sounds are normal. He exhibits no distension. There is no tenderness.  Musculoskeletal: Normal range of motion. He exhibits no edema or tenderness.  Neurological: He is alert and oriented to person, place, and time. He has normal reflexes. No cranial nerve deficit.  Skin: Skin is warm and dry. No rash noted. No erythema.  Psychiatric: He has a normal mood and affect. His behavior is normal. Judgment and thought content normal.  Vitals reviewed.   Diabetic Foot Exam - Simple   Simple Foot Form Diabetic Foot exam was performed with the following findings:  Yes 02/02/2018  4:02 PM  Visual Inspection No deformities, no ulcerations, no other skin breakdown bilaterally:  Yes Sensation Testing Intact to touch and monofilament testing bilaterally:  Yes Pulse Check Posterior Tibialis and Dorsalis pulse intact bilaterally:  Yes Comments Toe nails thick and discolored       BP 132/85   Pulse 67   Temp (!) 97.4 F (36.3 C) (Oral)   Ht _0  (1.803 m)   Wt 204 lb 3.2 oz (92.6 kg)   BMI 28.48 kg/m      Assessment & Plan:  Corey Cruz comes in today with chief complaint of Annual Exam   Diagnosis and orders addressed:  1. Annual physical exam - Bayer DCA Hb A1c Waived - CMP14+EGFR - CBC with Differential/Platelet - Lipid panel - TSH - PSA, total and free - VITAMIN D 25 Hydroxy (Vit-D Deficiency, Fractures)  2. Hyperlipidemia, unspecified hyperlipidemia type - atorvastatin (LIPITOR) 20 MG tablet; Take 1  tablet (20 mg total) by mouth daily.  Dispense: 90 tablet; Refill: 2 - CMP14+EGFR - CBC with Differential/Platelet - Lipid panel  3. Essential hypertension - lisinopril (PRINIVIL,ZESTRIL) 10 MG tablet; Take 1 tablet (10 mg total) by mouth daily.  Dispense: 90 tablet; Refill: 2 - CMP14+EGFR - CBC with Differential/Platelet  4. Chronic bilateral low back pain without sciatica - meloxicam (MOBIC) 15 MG tablet; Take 1 tablet (15 mg total) by mouth daily.  Dispense: 90 tablet; Refill: 2 - CMP14+EGFR - CBC with Differential/Platelet  5. Diabetes mellitus without complication (HCC) - empagliflozin (JARDIANCE) 10 MG TABS tablet; Take 10 mg by mouth daily.  Dispense: 90 tablet; Refill: 2 - metFORMIN (GLUCOPHAGE) 1000 MG tablet; Take 1 tablet (1,000 mg total) by mouth 2 (two) times daily with a meal.  Dispense: 180 tablet; Refill: 2 - Bayer DCA Hb A1c Waived - CMP14+EGFR - CBC with Differential/Platelet  6. Overweight (BMI 25.0-29.9) - CMP14+EGFR -  CBC with Differential/Platelet  7. Vitamin D deficiency - CMP14+EGFR - CBC with Differential/Platelet - VITAMIN D 25 Hydroxy (Vit-D Deficiency, Fractures)  8. Hypertension associated with diabetes ([place])  9. Hyperlipidemia associated with type 2 diabetes mellitus ([place])  10. Onychomycosis Keep clean and dry - terbinafine (LAMISIL) 250 MG tablet; Take 1 tablet (250 mg total) by mouth daily.  Dispense: 90 tablet; Refill: 1   Labs pending Health Maintenance reviewed Diet and exercise encouraged  Follow up plan: 4 months    Evelina Dun, FNP

## 2018-02-02 NOTE — Patient Instructions (Signed)

## 2018-02-03 ENCOUNTER — Other Ambulatory Visit: Payer: Self-pay | Admitting: Family

## 2018-02-03 LAB — CBC WITH DIFFERENTIAL/PLATELET
BASOS: 0 %
Basophils Absolute: 0 10*3/uL (ref 0.0–0.2)
EOS (ABSOLUTE): 0.1 10*3/uL (ref 0.0–0.4)
EOS: 1 %
HEMATOCRIT: 45.4 % (ref 37.5–51.0)
HEMOGLOBIN: 15.9 g/dL (ref 13.0–17.7)
IMMATURE GRANS (ABS): 0 10*3/uL (ref 0.0–0.1)
IMMATURE GRANULOCYTES: 0 %
LYMPHS: 33 %
Lymphocytes Absolute: 2.3 10*3/uL (ref 0.7–3.1)
MCH: 32.4 pg (ref 26.6–33.0)
MCHC: 35 g/dL (ref 31.5–35.7)
MCV: 93 fL (ref 79–97)
MONOCYTES: 12 %
Monocytes Absolute: 0.8 10*3/uL (ref 0.1–0.9)
NEUTROS PCT: 54 %
Neutrophils Absolute: 3.8 10*3/uL (ref 1.4–7.0)
Platelets: 303 10*3/uL (ref 150–450)
RBC: 4.9 x10E6/uL (ref 4.14–5.80)
RDW: 12.4 % (ref 12.3–15.4)
WBC: 7 10*3/uL (ref 3.4–10.8)

## 2018-02-03 LAB — CMP14+EGFR
ALT: 24 IU/L (ref 0–44)
AST: 20 IU/L (ref 0–40)
Albumin/Globulin Ratio: 2.1 (ref 1.2–2.2)
Albumin: 4.8 g/dL (ref 3.5–5.5)
Alkaline Phosphatase: 67 IU/L (ref 39–117)
BUN/Creatinine Ratio: 25 — ABNORMAL HIGH (ref 9–20)
BUN: 20 mg/dL (ref 6–24)
Bilirubin Total: 0.8 mg/dL (ref 0.0–1.2)
CALCIUM: 9.8 mg/dL (ref 8.7–10.2)
CO2: 22 mmol/L (ref 20–29)
CREATININE: 0.8 mg/dL (ref 0.76–1.27)
Chloride: 101 mmol/L (ref 96–106)
GFR calc Af Amer: 121 mL/min/{1.73_m2} (ref >59)
GFR, EST NON AFRICAN AMERICAN: 105 mL/min/{1.73_m2} (ref >59)
Globulin, Total: 2.3 g/dL (ref 1.5–4.5)
Glucose: 81 mg/dL (ref 65–99)
Potassium: 4.1 mmol/L (ref 3.5–5.2)
SODIUM: 140 mmol/L (ref 134–144)
Total Protein: 7.1 g/dL (ref 6.0–8.5)

## 2018-02-03 LAB — LIPID PANEL
CHOL/HDL RATIO: 3.7 ratio (ref 0.0–5.0)
Cholesterol, Total: 172 mg/dL (ref 100–199)
HDL: 47 mg/dL (ref >39)
LDL CALC: 107 mg/dL — AB (ref 0–99)
Triglycerides: 91 mg/dL (ref 0–149)
VLDL CHOLESTEROL CAL: 18 mg/dL (ref 5–40)

## 2018-02-03 LAB — VITAMIN D 25 HYDROXY (VIT D DEFICIENCY, FRACTURES): Vit D, 25-Hydroxy: 50.4 ng/mL (ref 30.0–100.0)

## 2018-02-03 LAB — PSA, TOTAL AND FREE
PSA, Free Pct: 60 %
PSA, Free: 0.06 ng/mL
Prostate Specific Ag, Serum: 0.1 ng/mL (ref 0.0–4.0)

## 2018-02-03 LAB — TSH: TSH: 1.23 u[IU]/mL (ref 0.450–4.500)

## 2018-03-21 LAB — HM DIABETES EYE EXAM

## 2018-07-31 ENCOUNTER — Ambulatory Visit: Payer: Managed Care, Other (non HMO) | Admitting: Family

## 2018-08-27 ENCOUNTER — Other Ambulatory Visit: Payer: Self-pay

## 2018-08-28 ENCOUNTER — Ambulatory Visit (INDEPENDENT_AMBULATORY_CARE_PROVIDER_SITE_OTHER): Payer: Managed Care, Other (non HMO) | Admitting: Family

## 2018-08-28 ENCOUNTER — Encounter: Payer: Self-pay | Admitting: Family

## 2018-08-28 VITALS — BP 116/70 | HR 64 | Temp 98.6°F | Ht 71.0 in | Wt 200.6 lb

## 2018-08-28 DIAGNOSIS — L2381 Allergic contact dermatitis due to animal (cat) (dog) dander: Secondary | ICD-10-CM

## 2018-08-28 MED ORDER — TRIAMCINOLONE ACETONIDE 0.5 % EX OINT: 1.0000 "application " | TOPICAL_OINTMENT | Freq: Two times a day (BID) | CUTANEOUS | 2 refills | Status: DC

## 2018-08-28 MED ORDER — METHYLPREDNISOLONE ACETATE 80 MG/ML IJ SUSP
80.0000 mg | Freq: Once | INTRAMUSCULAR | Status: AC
  Administered 2018-08-28: 80 mg via INTRAMUSCULAR

## 2018-08-28 NOTE — Patient Instructions (Signed)
Contact Dermatitis  Dermatitis is redness, soreness, and swelling (inflammation) of the skin. Contact dermatitis is a reaction to certain substances that touch the skin. Many different substances can cause contact dermatitis. There are two types of contact dermatitis:   Irritant contact dermatitis. This type is caused by something that irritates your skin, such as having dry hands from washing them too often with soap. This type does not require previous exposure to the substance for a reaction to occur. This is the most common type.   Allergic contact dermatitis. This type is caused by a substance that you are allergic to, such as poison ivy. This type occurs when you have been exposed to the substance (allergen) and develop a sensitivity to it. Dermatitis may develop soon after your first exposure to the allergen, or it may not develop until the next time you are exposed and every time thereafter.  What are the causes?  Irritant contact dermatitis is most commonly caused by exposure to:   Makeup.   Soaps.   Detergents.   Bleaches.   Acids.   Metal salts, such as nickel.  Allergic contact dermatitis is most commonly caused by exposure to:   Poisonous plants.   Chemicals.   Jewelry.   Latex.   Medicines.   Preservatives in products, such as clothing.  What increases the risk?  You are more likely to develop this condition if you have:   A job that exposes you to irritants or allergens.   Certain medical conditions, such as asthma or eczema.  What are the signs or symptoms?  Symptoms of this condition may occur on your body anywhere the irritant has touched you or is touched by you.   Symptoms include:  ? Dryness or flaking.  ? Redness.  ? Cracks.  ? Itching.  ? Pain or a burning feeling.  ? Blisters.  ? Drainage of small amounts of blood or clear fluid from skin cracks.  With allergic contact dermatitis, there may also be swelling in areas such as the eyelids, mouth, or genitals.  How is this  diagnosed?  This condition is diagnosed with a medical history and physical exam.   A patch skin test may be performed to help determine the cause.   If the condition is related to your job, you may need to see an occupational medicine specialist.  How is this treated?  This condition is treated by checking for the cause of the reaction and protecting your skin from further contact. Treatment may also include:   Steroid creams or ointments. Oral steroid medicines may be needed in more severe cases.   Antibiotic medicines or antibacterial ointments, if a skin infection is present.   Antihistamine lotion or an antihistamine taken by mouth to ease itching.   A bandage (dressing).  Follow these instructions at home:  Skin care   Moisturize your skin as needed.   Apply cool compresses to the affected areas.   Try applying baking soda paste to your skin. Stir water into baking soda until it reaches a paste-like consistency.   Do not scratch your skin, and avoid friction to the affected area.   Avoid the use of soaps, perfumes, and dyes.  Medicines   Take or apply over-the-counter and prescription medicines only as told by your health care provider.   If you were prescribed an antibiotic medicine, take or apply the antibiotic as told by your health care provider. Do not stop using the antibiotic even if your condition   improves.  Bathing   Try taking a bath with:  ? Epsom salts. Follow the instructions on the packaging. You can get these at your local pharmacy or grocery store.  ? Baking soda. Pour a small amount into the bath as directed by your health care provider.  ? Colloidal oatmeal. Follow the instructions on the packaging. You can get this at your local pharmacy or grocery store.   Bathe less frequently, such as every other day.   Bathe in lukewarm water. Avoid using hot water.  Bandage care   If you were given a bandage (dressing), change it as told by your health care provider.   Wash your hands  with soap and water before and after you change your dressing. If soap and water are not available, use hand sanitizer.  General instructions   Avoid the substance that caused your reaction. If you do not know what caused it, keep a journal to try to track what caused it. Write down:  ? What you eat.  ? What cosmetic products you use.  ? What you drink.  ? What you wear in the affected area. This includes jewelry.   Check the affected areas every day for signs of infection. Check for:  ? More redness, swelling, or pain.  ? More fluid or blood.  ? Warmth.  ? Pus or a bad smell.   Keep all follow-up visits as told by your health care provider. This is important.  Contact a health care provider if:   Your condition does not improve with treatment.   Your condition gets worse.   You have signs of infection such as swelling, tenderness, redness, soreness, or warmth in the affected area.   You have a fever.   You have new symptoms.  Get help right away if:   You have a severe headache, neck pain, or neck stiffness.   You vomit.   You feel very sleepy.   You notice red streaks coming from the affected area.   Your bone or joint underneath the affected area becomes painful after the skin has healed.   The affected area turns darker.   You have difficulty breathing.  Summary   Dermatitis is redness, soreness, and swelling (inflammation) of the skin. Contact dermatitis is a reaction to certain substances that touch the skin.   Symptoms of this condition may occur on your body anywhere the irritant has touched you or is touched by you.   This condition is treated by figuring out what caused the reaction and protecting your skin from further contact. Treatment may also include medicines and skin care.   Avoid the substance that caused your reaction. If you do not know what caused it, keep a journal to try to track what caused it.   Contact a health care provider if your condition gets worse or you have signs  of infection such as swelling, tenderness, redness, soreness, or warmth in the affected area.  This information is not intended to replace advice given to you by your health care provider. Make sure you discuss any questions you have with your health care provider.  Document Released: 04/19/2000 Document Revised: 11/05/2017 Document Reviewed: 11/05/2017  Elsevier Interactive Patient Education  2019 [employer]

## 2018-08-28 NOTE — Progress Notes (Signed)
   Subjective:    Patient ID: Corey Cruz, male    DOB: [birth year], 48 y.o.   MRN: [id number]  Chief Complaint  Patient presents with  . eczemia    Rash  The current episode started more than 1 month ago. The problem is unchanged. The affected locations include the abdomen, back, left arm, right arm, right lower leg and right upper leg. The rash is characterized by dryness, itchiness and redness. Associated with: new dog. Past treatments include moisturizer and anti-itch cream. The treatment provided mild relief. His past medical history is significant for eczema.      Review of Systems  Skin: Positive for rash.  All other systems reviewed and are negative.      Objective:   Physical Exam Vitals signs reviewed.  Constitutional:      General: He is not in acute distress.    Appearance: He is well-developed.  HENT:     Head: Normocephalic.     Right Ear: Tympanic membrane normal.     Left Ear: Tympanic membrane normal.  Eyes:     General:        Right eye: No discharge.        Left eye: No discharge.     Pupils: Pupils are equal, round, and reactive to light.  Neck:     Musculoskeletal: Normal range of motion and neck supple.     Thyroid: No thyromegaly.  Cardiovascular:     Rate and Rhythm: Normal rate and regular rhythm.     Heart sounds: Normal heart sounds. No murmur.  Pulmonary:     Effort: Pulmonary effort is normal. No respiratory distress.     Breath sounds: Normal breath sounds. No wheezing.  Abdominal:     General: Bowel sounds are normal. There is no distension.     Palpations: Abdomen is soft.     Tenderness: There is no abdominal tenderness.  Musculoskeletal: Normal range of motion.        General: No tenderness.  Skin:    General: Skin is warm and dry.     Findings: Rash present. No erythema.     Comments: Erythemas dry rash on abdomen, back, bilateral legs, and arms  Neurological:     Mental Status: He is alert and oriented to person, place,  and time.     Cranial Nerves: No cranial nerve deficit.     Deep Tendon Reflexes: Reflexes are normal and symmetric.  Psychiatric:        Behavior: Behavior normal.        Thought Content: Thought content normal.        Judgment: Judgment normal.     BP 116/70   Pulse 64   Temp 98.6 F (37 C) (Oral)   Ht 5\' 11"  (1.803 m)   Wt 200 lb 9.6 oz (91 kg)   BMI 27.98 kg/m        Assessment & Plan:  CORIAN HANDLEY comes in today with chief complaint of eczemia   Diagnosis and orders addressed:  1. Allergic contact dermatitis due to animal dander Do not allow dog on bed or couch Wash all bedding Avoid hot showers Apply moisturizer after shower RTO as needed - methylPREDNISolone acetate (DEPO-MEDROL) injection 80 mg - triamcinolone ointment (KENALOG) 0.5 %; Apply 1 application topically 2 (two) times daily.  Dispense: 90 g; Refill: 2   Evelina Dun, FNP

## 2018-09-04 ENCOUNTER — Other Ambulatory Visit: Payer: Self-pay | Admitting: Family

## 2018-09-04 DIAGNOSIS — N529 Male erectile dysfunction, unspecified: Secondary | ICD-10-CM

## 2018-09-04 NOTE — Telephone Encounter (Signed)
What is the name of the medication? Sildenafil 100 mg  Have you contacted your pharmacy to request a refill? YES  Which pharmacy would you like this sent to? [facility]   Patient notified that their request is being sent to the clinical staff for review and that they should receive a call once it is complete. If they do not receive a call within 24 hours they can check with their pharmacy or our office.

## 2018-09-04 NOTE — Telephone Encounter (Signed)
Refill came in from pharmacy, sent to provider for authorization

## 2018-09-04 NOTE — Telephone Encounter (Signed)
Pt aware refill sent to pharmacy 

## 2018-09-22 ENCOUNTER — Telehealth: Payer: Self-pay | Admitting: Family

## 2018-10-02 ENCOUNTER — Other Ambulatory Visit: Payer: Self-pay | Admitting: Family

## 2018-10-02 DIAGNOSIS — N529 Male erectile dysfunction, unspecified: Secondary | ICD-10-CM

## 2018-10-05 ENCOUNTER — Ambulatory Visit: Payer: Managed Care, Other (non HMO) | Admitting: Family

## 2018-10-30 ENCOUNTER — Encounter: Payer: Self-pay | Admitting: Family

## 2018-10-30 ENCOUNTER — Other Ambulatory Visit: Payer: Self-pay

## 2018-10-30 ENCOUNTER — Ambulatory Visit (INDEPENDENT_AMBULATORY_CARE_PROVIDER_SITE_OTHER): Payer: Managed Care, Other (non HMO) | Admitting: Family

## 2018-10-30 DIAGNOSIS — E119 Type 2 diabetes mellitus without complications: Secondary | ICD-10-CM

## 2018-10-30 DIAGNOSIS — E1169 Type 2 diabetes mellitus with other specified complication: Secondary | ICD-10-CM

## 2018-10-30 DIAGNOSIS — E1159 Type 2 diabetes mellitus with other circulatory complications: Secondary | ICD-10-CM | POA: Diagnosis not present

## 2018-10-30 DIAGNOSIS — M545 Low back pain: Secondary | ICD-10-CM | POA: Diagnosis not present

## 2018-10-30 DIAGNOSIS — E785 Hyperlipidemia, unspecified: Secondary | ICD-10-CM

## 2018-10-30 DIAGNOSIS — G8929 Other chronic pain: Secondary | ICD-10-CM

## 2018-10-30 DIAGNOSIS — E663 Overweight: Secondary | ICD-10-CM

## 2018-10-30 DIAGNOSIS — I10 Essential (primary) hypertension: Secondary | ICD-10-CM

## 2018-10-30 LAB — BAYER DCA HB A1C WAIVED: HB A1C (BAYER DCA - WAIVED): 5.9 % (ref <7.0)

## 2018-10-30 NOTE — Progress Notes (Signed)
Virtual Visit via telephone Note  I connected with Corey Cruz on 10/30/18 at 11:46 AM by telephone and verified that I am speaking with the correct person using two identifiers. Corey Cruz is currently located at home and no one is currently with her during visit. The provider, Evelina Dun, FNP is located in their office at time of visit.  I discussed the limitations, risks, security and privacy concerns of performing an evaluation and management service by telephone and the availability of in person appointments. I also discussed with the patient that there may be a patient responsible charge related to this service. The patient expressed understanding and agreed to proceed.   History and Present Illness:  Diabetes He presents for his follow-up diabetic visit. He has type 2 diabetes mellitus. His disease course has been stable. There are no hypoglycemic associated symptoms. Pertinent negatives for diabetes include no blurred vision, no foot paresthesias and no visual change. Symptoms are stable. Pertinent negatives for diabetic complications include no CVA, heart disease, nephropathy or peripheral neuropathy. Risk factors for coronary artery disease include dyslipidemia, diabetes mellitus, male sex, sedentary lifestyle and hypertension. He is following a generally unhealthy diet. His overall blood glucose range is 110-130 mg/dl. Eye exam is current.  Hypertension This is a chronic problem. The current episode started more than 1 year ago. The problem has been resolved since onset. The problem is controlled. Pertinent negatives include no blurred vision, malaise/fatigue, peripheral edema or shortness of breath. Risk factors for coronary artery disease include dyslipidemia, diabetes mellitus, obesity and male gender. The current treatment provides moderate improvement. There is no history of kidney disease, CAD/MI, CVA or heart failure.  Hyperlipidemia This is a chronic problem.  The current episode started more than 1 year ago. The problem is controlled. Pertinent negatives include no shortness of breath. Current antihyperlipidemic treatment includes statins. The current treatment provides moderate improvement of lipids. Risk factors for coronary artery disease include dyslipidemia, diabetes mellitus, male sex, hypertension and a sedentary lifestyle.  Back Pain This is a chronic problem. The current episode started more than 1 year ago. The problem occurs intermittently. The problem has been waxing and waning since onset. The pain is mild. The symptoms are aggravated by twisting and bending. He has tried NSAIDs for the symptoms. The treatment provided mild relief.      Review of Systems  Constitutional: Negative for malaise/fatigue.  Eyes: Negative for blurred vision.  Respiratory: Negative for shortness of breath.   Musculoskeletal: Positive for back pain.  All other systems reviewed and are negative.    Observations/Objective: No SOB or distress noted   Assessment and Plan: FRANS VALENTE comes in today with chief complaint of No chief complaint on file.   Diagnosis and orders addressed:  1. Diabetes mellitus without complication ([place]) - Bayer DCA Hb A1c Waived; Future - CMP14+EGFR; Future - CBC with Differential/Platelet; Future - Microalbumin / creatinine urine ratio; Future  2. Hyperlipidemia associated with type 2 diabetes mellitus (HCC) - CMP14+EGFR; Future - CBC with Differential/Platelet; Future - Lipid panel; Future  3. Hypertension associated with diabetes ([place]) - CMP14+EGFR; Future - CBC with Differential/Platelet; Future  4. Overweight (BMI 25.0-29.9) - CMP14+EGFR; Future - CBC with Differential/Platelet; Future  5. Chronic bilateral low back pain without sciatica - CMP14+EGFR; Future - CBC with Differential/Platelet; Future   Labs pending Health Maintenance reviewed Diet and exercise encouraged  Follow up plan: 6  months      I discussed the assessment and  treatment plan with the patient. The patient was provided an opportunity to ask questions and all were answered. The patient agreed with the plan and demonstrated an understanding of the instructions.   The patient was advised to call back or seek an in-person evaluation if the symptoms worsen or if the condition fails to improve as anticipated.  The above assessment and management plan was discussed with the patient. The patient verbalized understanding of and has agreed to the management plan. Patient is aware to call the clinic if symptoms persist or worsen. Patient is aware when to return to the clinic for a follow-up visit. Patient educated on when it is appropriate to go to the emergency department.   Time call ended:  11: 58 AM  I provided 12 minutes of non-face-to-face time during this encounter.    Evelina Dun, FNP

## 2018-10-30 NOTE — Addendum Note (Signed)
Addended by: Pollyann Kennedy F on: 10/30/2018 12:46 PM   Modules accepted: Orders

## 2018-10-31 LAB — CMP14+EGFR
ALT: 16 IU/L (ref 0–44)
AST: 13 IU/L (ref 0–40)
Albumin/Globulin Ratio: 2.1 (ref 1.2–2.2)
Albumin: 4.5 g/dL (ref 4.0–5.0)
Alkaline Phosphatase: 64 IU/L (ref 39–117)
BUN/Creatinine Ratio: 24 — ABNORMAL HIGH (ref 9–20)
BUN: 19 mg/dL (ref 6–24)
Bilirubin Total: 0.3 mg/dL (ref 0.0–1.2)
CO2: 21 mmol/L (ref 20–29)
Calcium: 9.2 mg/dL (ref 8.7–10.2)
Chloride: 102 mmol/L (ref 96–106)
Creatinine, Ser: 0.8 mg/dL (ref 0.76–1.27)
GFR calc Af Amer: 121 mL/min/{1.73_m2} (ref >59)
GFR calc non Af Amer: 105 mL/min/{1.73_m2} (ref >59)
Globulin, Total: 2.1 g/dL (ref 1.5–4.5)
Glucose: 104 mg/dL — ABNORMAL HIGH (ref 65–99)
Potassium: 4.4 mmol/L (ref 3.5–5.2)
Sodium: 139 mmol/L (ref 134–144)
Total Protein: 6.6 g/dL (ref 6.0–8.5)

## 2018-10-31 LAB — CBC WITH DIFFERENTIAL/PLATELET
Basophils Absolute: 0 10*3/uL (ref 0.0–0.2)
Basos: 1 %
EOS (ABSOLUTE): 0.2 10*3/uL (ref 0.0–0.4)
Eos: 3 %
Hematocrit: 45.9 % (ref 37.5–51.0)
Hemoglobin: 15.5 g/dL (ref 13.0–17.7)
Immature Grans (Abs): 0 10*3/uL (ref 0.0–0.1)
Immature Granulocytes: 0 %
Lymphocytes Absolute: 2 10*3/uL (ref 0.7–3.1)
Lymphs: 28 %
MCH: 32.3 pg (ref 26.6–33.0)
MCHC: 33.8 g/dL (ref 31.5–35.7)
MCV: 96 fL (ref 79–97)
Monocytes Absolute: 0.9 10*3/uL (ref 0.1–0.9)
Monocytes: 12 %
Neutrophils Absolute: 4.1 10*3/uL (ref 1.4–7.0)
Neutrophils: 56 %
Platelets: 311 10*3/uL (ref 150–450)
RBC: 4.8 x10E6/uL (ref 4.14–5.80)
RDW: 12.4 % (ref 11.6–15.4)
WBC: 7.2 10*3/uL (ref 3.4–10.8)

## 2018-10-31 LAB — LIPID PANEL
Chol/HDL Ratio: 3.8 ratio (ref 0.0–5.0)
Cholesterol, Total: 152 mg/dL (ref 100–199)
HDL: 40 mg/dL (ref >39)
LDL Calculated: 88 mg/dL (ref 0–99)
Triglycerides: 120 mg/dL (ref 0–149)
VLDL Cholesterol Cal: 24 mg/dL (ref 5–40)

## 2018-10-31 LAB — MICROALBUMIN / CREATININE URINE RATIO
Creatinine, Urine: 75.8 mg/dL
Microalb/Creat Ratio: <4 mg/g creat (ref 0–29)
Microalbumin, Urine: <3 ug/mL

## 2019-01-02 ENCOUNTER — Other Ambulatory Visit: Payer: Self-pay | Admitting: Family

## 2019-01-02 DIAGNOSIS — N529 Male erectile dysfunction, unspecified: Secondary | ICD-10-CM

## 2019-01-10 ENCOUNTER — Other Ambulatory Visit: Payer: Self-pay | Admitting: Family

## 2019-01-10 DIAGNOSIS — L2381 Allergic contact dermatitis due to animal (cat) (dog) dander: Secondary | ICD-10-CM

## 2019-01-14 ENCOUNTER — Other Ambulatory Visit: Payer: Self-pay

## 2019-01-15 ENCOUNTER — Encounter: Payer: Self-pay | Admitting: Family

## 2019-01-15 ENCOUNTER — Ambulatory Visit (INDEPENDENT_AMBULATORY_CARE_PROVIDER_SITE_OTHER): Payer: Managed Care, Other (non HMO) | Admitting: Family

## 2019-01-15 VITALS — BP 124/74 | HR 78 | Temp 98.6°F | Resp 20 | Ht 71.0 in | Wt 199.0 lb

## 2019-01-15 DIAGNOSIS — Z23 Encounter for immunization: Secondary | ICD-10-CM | POA: Diagnosis not present

## 2019-01-15 DIAGNOSIS — Z0001 Encounter for general adult medical examination with abnormal findings: Secondary | ICD-10-CM | POA: Diagnosis not present

## 2019-01-15 DIAGNOSIS — E785 Hyperlipidemia, unspecified: Secondary | ICD-10-CM

## 2019-01-15 DIAGNOSIS — I10 Essential (primary) hypertension: Secondary | ICD-10-CM

## 2019-01-15 DIAGNOSIS — E559 Vitamin D deficiency, unspecified: Secondary | ICD-10-CM

## 2019-01-15 DIAGNOSIS — E119 Type 2 diabetes mellitus without complications: Secondary | ICD-10-CM

## 2019-01-15 DIAGNOSIS — E1169 Type 2 diabetes mellitus with other specified complication: Secondary | ICD-10-CM | POA: Diagnosis not present

## 2019-01-15 DIAGNOSIS — Z1211 Encounter for screening for malignant neoplasm of colon: Secondary | ICD-10-CM

## 2019-01-15 DIAGNOSIS — Z0000 Encounter for general adult medical examination without abnormal findings: Secondary | ICD-10-CM

## 2019-01-15 DIAGNOSIS — M545 Low back pain: Secondary | ICD-10-CM

## 2019-01-15 DIAGNOSIS — E663 Overweight: Secondary | ICD-10-CM

## 2019-01-15 DIAGNOSIS — G8929 Other chronic pain: Secondary | ICD-10-CM

## 2019-01-15 DIAGNOSIS — R21 Rash and other nonspecific skin eruption: Secondary | ICD-10-CM

## 2019-01-15 DIAGNOSIS — E1159 Type 2 diabetes mellitus with other circulatory complications: Secondary | ICD-10-CM | POA: Diagnosis not present

## 2019-01-15 LAB — BAYER DCA HB A1C WAIVED: HB A1C (BAYER DCA - WAIVED): 6.1 % (ref <7.0)

## 2019-01-15 MED ORDER — HYDROXYZINE PAMOATE 25 MG PO CAPS: 25.0000 mg | ORAL_CAPSULE | Freq: Three times a day (TID) | ORAL | 2 refills | Status: DC | PRN

## 2019-01-15 MED ORDER — PREDNISONE 10 MG (21) PO TBPK: ORAL_TABLET | ORAL | 0 refills | Status: DC

## 2019-01-15 NOTE — Addendum Note (Signed)
Addended by: Evelina Dun A on: 01/15/2019 03:11 PM   Modules accepted: Orders

## 2019-01-15 NOTE — Addendum Note (Signed)
Addended by: Shelbie Ammons on: 01/15/2019 03:38 PM   Modules accepted: Orders

## 2019-01-15 NOTE — Patient Instructions (Signed)

## 2019-01-15 NOTE — Progress Notes (Addendum)
Subjective:    Patient ID: Corey Cruz, male    DOB: 04/05/1971, 48 y.o.   MRN: [id number]  Chief Complaint  Patient presents with  . Annual Exam    with blood work   Pt presents to the office today for CPE.  Diabetes He presents for his follow-up diabetic visit. He has type 2 diabetes mellitus. His disease course has been stable. There are no hypoglycemic associated symptoms. Pertinent negatives for diabetes include no blurred vision. There are no hypoglycemic complications. Symptoms are stable. There are no diabetic complications. Risk factors for coronary artery disease include dyslipidemia, diabetes mellitus, male sex, hypertension and sedentary lifestyle. He is following a generally unhealthy diet. His overall blood glucose range is 90-110 mg/dl. Eye exam is current.  Hyperlipidemia This is a chronic problem. The current episode started more than 1 year ago. The problem is controlled. Recent lipid tests were reviewed and are normal. Exacerbating diseases include obesity. Pertinent negatives include no shortness of breath. Current antihyperlipidemic treatment includes statins. The current treatment provides moderate improvement of lipids. Risk factors for coronary artery disease include diabetes mellitus, hypertension, male sex, a sedentary lifestyle and dyslipidemia.  Hypertension This is a chronic problem. The current episode started more than 1 year ago. The problem has been resolved since onset. The problem is controlled. Pertinent negatives include no blurred vision, malaise/fatigue, peripheral edema or shortness of breath. Risk factors for coronary artery disease include dyslipidemia, diabetes mellitus, obesity, male gender and sedentary lifestyle. The current treatment provides moderate improvement. There is no history of kidney disease, CAD/MI or heart failure.  Back Pain This is a chronic problem. The current episode started more than 1 year ago. The problem occurs  intermittently. The problem has been waxing and waning since onset. The pain is present in the lumbar spine. The quality of the pain is described as aching. The pain is moderate.  Rash This is a recurrent problem. The problem has been waxing and waning since onset. The affected locations include the left arm, right arm, back and abdomen. The rash is characterized by redness and itchiness. Associated with: dog. Pertinent negatives include no shortness of breath. Past treatments include anti-itch cream and antihistamine. The treatment provided moderate relief. His past medical history is significant for eczema.      Review of Systems  Constitutional: Negative for malaise/fatigue.  Eyes: Negative for blurred vision.  Respiratory: Negative for shortness of breath.   Musculoskeletal: Positive for back pain.  Skin: Positive for rash.  All other systems reviewed and are negative.  Family History  Problem Relation Age of Onset  . Cancer Mother        lung  . Cancer Brother        leukemia    Social History   Socioeconomic History  . Marital status: Single    Spouse name: Not on file  . Number of children: Not on file  . Years of education: Not on file  . Highest education level: Not on file  Occupational History  . Not on file  Social Needs  . Financial resource strain: Not on file  . Food insecurity    Worry: Not on file    Inability: Not on file  . Transportation needs    Medical: Not on file    Non-medical: Not on file  Tobacco Use  . Smoking status: Never Smoker  . Smokeless tobacco: Never Used  Substance and Sexual Activity  . Alcohol use: Yes  Alcohol/week: 1.0 standard drinks    Types: 1 Shots of liquor per week  . Drug use: No  . Sexual activity: Yes    Birth control/protection: None  Lifestyle  . Physical activity    Days per week: Not on file    Minutes per session: Not on file  . Stress: Not on file  Relationships  . Social Herbalist on phone:  Not on file    Gets together: Not on file    Attends religious service: Not on file    Active member of club or organization: Not on file    Attends meetings of clubs or organizations: Not on file    Relationship status: Not on file  Other Topics Concern  . Not on file  Social History Narrative  . Not on file        Objective:   Physical Exam Vitals signs reviewed.  Constitutional:      General: He is not in acute distress.    Appearance: He is well-developed.  HENT:     Head: Normocephalic.     Right Ear: Tympanic membrane normal.     Left Ear: Tympanic membrane normal.  Eyes:     General:        Right eye: No discharge.        Left eye: No discharge.     Pupils: Pupils are equal, round, and reactive to light.  Neck:     Musculoskeletal: Normal range of motion and neck supple.     Thyroid: No thyromegaly.  Cardiovascular:     Rate and Rhythm: Normal rate and regular rhythm.     Heart sounds: Normal heart sounds. No murmur.  Pulmonary:     Effort: Pulmonary effort is normal. No respiratory distress.     Breath sounds: Normal breath sounds. No wheezing.  Abdominal:     General: Bowel sounds are normal. There is no distension.     Palpations: Abdomen is soft.     Tenderness: There is no abdominal tenderness.  Musculoskeletal: Normal range of motion.        General: No tenderness.  Skin:    General: Skin is warm and dry.     Findings: No erythema or rash.  Neurological:     Mental Status: He is alert and oriented to person, place, and time.     Cranial Nerves: No cranial nerve deficit.     Deep Tendon Reflexes: Reflexes are normal and symmetric.  Psychiatric:        Behavior: Behavior normal.        Thought Content: Thought content normal.        Judgment: Judgment normal.     BP 124/74   Pulse 78   Temp 98.6 F (37 C) (Temporal)   Resp 20   Ht 5' 11" (1.803 m)   Wt 199 lb (90.3 kg)   SpO2 96%   BMI 27.75 kg/m      Assessment & Plan:  Corey Cruz comes in today with chief complaint of Annual Exam (with blood work)   Diagnosis and orders addressed:  1. Annual physical exam - CMP14+EGFR - CBC with Differential/Platelet - PSA, total and free - TSH  2. Hypertension associated with diabetes ([place]) - CMP14+EGFR - CBC with Differential/Platelet  3. Diabetes mellitus without complication ([place]) - AJO87+OMVE - CBC with Differential/Platelet - Bayer DCA Hb A1c Waived  4. Hyperlipidemia associated with type 2 diabetes mellitus (HCC) - CMP14+EGFR -  CBC with Differential/Platelet  5. Overweight (BMI 25.0-29.9) - CMP14+EGFR - CBC with Differential/Platelet  6. Chronic bilateral low back pain without sciatica  7. Vitamin D deficiency -Vit D pending  8. Colon cancer screening  9. Rash and nonspecific skin eruption - hydrOXYzine (VISTARIL) 25 MG capsule; Take 1 capsule (25 mg total) by mouth 3 (three) times daily as needed.  Dispense: 90 capsule; Refill: 2 -prednisone   Labs pending Health Maintenance reviewed Diet and exercise encouraged  Follow up plan: 6 months    Evelina Dun, FNP

## 2019-01-16 LAB — VITAMIN D 25 HYDROXY (VIT D DEFICIENCY, FRACTURES): Vit D, 25-Hydroxy: 31.4 ng/mL (ref 30.0–100.0)

## 2019-01-16 LAB — TSH: TSH: 0.977 u[IU]/mL (ref 0.450–4.500)

## 2019-01-16 LAB — CBC WITH DIFFERENTIAL/PLATELET
Basophils Absolute: 0.1 10*3/uL (ref 0.0–0.2)
Basos: 1 %
EOS (ABSOLUTE): 0.1 10*3/uL (ref 0.0–0.4)
Eos: 2 %
Hematocrit: 44 % (ref 37.5–51.0)
Hemoglobin: 15.2 g/dL (ref 13.0–17.7)
Immature Grans (Abs): 0 10*3/uL (ref 0.0–0.1)
Immature Granulocytes: 0 %
Lymphocytes Absolute: 2 10*3/uL (ref 0.7–3.1)
Lymphs: 29 %
MCH: 32.3 pg (ref 26.6–33.0)
MCHC: 34.5 g/dL (ref 31.5–35.7)
MCV: 93 fL (ref 79–97)
Monocytes Absolute: 1.1 10*3/uL — ABNORMAL HIGH (ref 0.1–0.9)
Monocytes: 15 %
Neutrophils Absolute: 3.7 10*3/uL (ref 1.4–7.0)
Neutrophils: 53 %
Platelets: 285 10*3/uL (ref 150–450)
RBC: 4.71 x10E6/uL (ref 4.14–5.80)
RDW: 11.9 % (ref 11.6–15.4)
WBC: 6.9 10*3/uL (ref 3.4–10.8)

## 2019-01-16 LAB — CMP14+EGFR
ALT: 19 IU/L (ref 0–44)
AST: 19 IU/L (ref 0–40)
Albumin/Globulin Ratio: 2 (ref 1.2–2.2)
Albumin: 4.6 g/dL (ref 4.0–5.0)
Alkaline Phosphatase: 64 IU/L (ref 39–117)
BUN/Creatinine Ratio: 23 — ABNORMAL HIGH (ref 9–20)
BUN: 18 mg/dL (ref 6–24)
Bilirubin Total: 0.5 mg/dL (ref 0.0–1.2)
CO2: 21 mmol/L (ref 20–29)
Calcium: 9.5 mg/dL (ref 8.7–10.2)
Chloride: 102 mmol/L (ref 96–106)
Creatinine, Ser: 0.79 mg/dL (ref 0.76–1.27)
GFR calc Af Amer: 121 mL/min/{1.73_m2} (ref >59)
GFR calc non Af Amer: 105 mL/min/{1.73_m2} (ref >59)
Globulin, Total: 2.3 g/dL (ref 1.5–4.5)
Glucose: 92 mg/dL (ref 65–99)
Potassium: 4.4 mmol/L (ref 3.5–5.2)
Sodium: 138 mmol/L (ref 134–144)
Total Protein: 6.9 g/dL (ref 6.0–8.5)

## 2019-01-16 LAB — PSA, TOTAL AND FREE
PSA, Free Pct: 50 %
PSA, Free: 0.05 ng/mL
Prostate Specific Ag, Serum: 0.1 ng/mL (ref 0.0–4.0)

## 2019-01-29 ENCOUNTER — Other Ambulatory Visit: Payer: Self-pay | Admitting: Family

## 2019-01-29 DIAGNOSIS — I10 Essential (primary) hypertension: Secondary | ICD-10-CM

## 2019-01-29 DIAGNOSIS — G8929 Other chronic pain: Secondary | ICD-10-CM

## 2019-01-29 DIAGNOSIS — E119 Type 2 diabetes mellitus without complications: Secondary | ICD-10-CM

## 2019-01-29 DIAGNOSIS — N529 Male erectile dysfunction, unspecified: Secondary | ICD-10-CM

## 2019-01-29 DIAGNOSIS — E785 Hyperlipidemia, unspecified: Secondary | ICD-10-CM

## 2019-03-07 ENCOUNTER — Other Ambulatory Visit: Payer: Self-pay | Admitting: Family

## 2019-03-07 DIAGNOSIS — L2381 Allergic contact dermatitis due to animal (cat) (dog) dander: Secondary | ICD-10-CM

## 2019-04-09 ENCOUNTER — Other Ambulatory Visit: Payer: Self-pay

## 2019-04-09 DIAGNOSIS — Z20822 Contact with and (suspected) exposure to covid-19: Secondary | ICD-10-CM

## 2019-04-13 LAB — NOVEL CORONAVIRUS, NAA: SARS-CoV-2, NAA: NOT DETECTED

## 2019-04-14 ENCOUNTER — Telehealth: Payer: Self-pay

## 2019-04-14 NOTE — Telephone Encounter (Signed)
Caller given negative result and verbalized understanding  

## 2019-04-29 ENCOUNTER — Other Ambulatory Visit: Payer: Self-pay | Admitting: Family

## 2019-04-29 DIAGNOSIS — R21 Rash and other nonspecific skin eruption: Secondary | ICD-10-CM

## 2019-04-29 DIAGNOSIS — G8929 Other chronic pain: Secondary | ICD-10-CM

## 2019-04-29 DIAGNOSIS — N529 Male erectile dysfunction, unspecified: Secondary | ICD-10-CM

## 2019-07-26 ENCOUNTER — Other Ambulatory Visit: Payer: Self-pay | Admitting: Nurse Practitioner

## 2019-07-26 ENCOUNTER — Other Ambulatory Visit: Payer: Self-pay | Admitting: Family

## 2019-07-26 DIAGNOSIS — G8929 Other chronic pain: Secondary | ICD-10-CM

## 2019-07-26 DIAGNOSIS — E119 Type 2 diabetes mellitus without complications: Secondary | ICD-10-CM

## 2019-07-26 DIAGNOSIS — E785 Hyperlipidemia, unspecified: Secondary | ICD-10-CM

## 2019-08-13 ENCOUNTER — Ambulatory Visit: Payer: Managed Care, Other (non HMO) | Admitting: Family

## 2019-08-24 ENCOUNTER — Ambulatory Visit (INDEPENDENT_AMBULATORY_CARE_PROVIDER_SITE_OTHER): Payer: Managed Care, Other (non HMO) | Admitting: Family

## 2019-08-24 ENCOUNTER — Encounter: Payer: Self-pay | Admitting: Family

## 2019-08-24 DIAGNOSIS — E1169 Type 2 diabetes mellitus with other specified complication: Secondary | ICD-10-CM

## 2019-08-24 DIAGNOSIS — E119 Type 2 diabetes mellitus without complications: Secondary | ICD-10-CM | POA: Diagnosis not present

## 2019-08-24 DIAGNOSIS — M545 Low back pain: Secondary | ICD-10-CM

## 2019-08-24 DIAGNOSIS — E1159 Type 2 diabetes mellitus with other circulatory complications: Secondary | ICD-10-CM

## 2019-08-24 DIAGNOSIS — E785 Hyperlipidemia, unspecified: Secondary | ICD-10-CM

## 2019-08-24 DIAGNOSIS — G8929 Other chronic pain: Secondary | ICD-10-CM

## 2019-08-24 DIAGNOSIS — I152 Hypertension secondary to endocrine disorders: Secondary | ICD-10-CM

## 2019-08-24 DIAGNOSIS — N529 Male erectile dysfunction, unspecified: Secondary | ICD-10-CM

## 2019-08-24 DIAGNOSIS — I10 Essential (primary) hypertension: Secondary | ICD-10-CM

## 2019-08-24 DIAGNOSIS — E663 Overweight: Secondary | ICD-10-CM

## 2019-08-24 MED ORDER — SILDENAFIL CITRATE 100 MG PO TABS: ORAL_TABLET | ORAL | 6 refills | Status: DC

## 2019-08-24 NOTE — Progress Notes (Signed)
Virtual Visit via telephone Note Due to COVID-19 pandemic this visit was conducted virtually. This visit type was conducted due to national recommendations for restrictions regarding the COVID-19 Pandemic (e.g. social distancing, sheltering in place) in an effort to limit this patient's exposure and mitigate transmission in our community. All issues noted in this document were discussed and addressed.  A physical exam was not performed with this format.  I connected with Corey Cruz on 08/24/19 at 1:41pm by telephone and verified that I am speaking with the correct person using two identifiers. Corey Cruz is currently located at work and no one is currently with him during visit. The provider, Evelina Dun, FNP is located in their office at time of visit.  I discussed the limitations, risks, security and privacy concerns of performing an evaluation and management service by telephone and the availability of in person appointments. I also discussed with the patient that there may be a patient responsible charge related to this service. The patient expressed understanding and agreed to proceed.   History and Present Illness:  Pt calls the office today for chronic follow up. Diabetes He presents for his follow-up diabetic visit. He has type 2 diabetes mellitus. His disease course has been stable. There are no hypoglycemic associated symptoms. Pertinent negatives for diabetes include no blurred vision, no foot paresthesias and no visual change. There are no hypoglycemic complications. Symptoms are stable. Pertinent negatives for diabetic complications include no CVA or heart disease. Risk factors for coronary artery disease include dyslipidemia, diabetes mellitus, hypertension, male sex and sedentary lifestyle. He is following a generally healthy diet. His overall blood glucose range is 110-130 mg/dl. Eye exam is not current.  Hypertension This is a chronic problem. The current  episode started more than 1 year ago. The problem has been resolved since onset. The problem is controlled. Pertinent negatives include no blurred vision, malaise/fatigue, peripheral edema or shortness of breath. Risk factors for coronary artery disease include dyslipidemia, diabetes mellitus, male gender and sedentary lifestyle. Past treatments include ACE inhibitors. The current treatment provides moderate improvement. There is no history of CVA.  Hyperlipidemia This is a chronic problem. The current episode started more than 1 year ago. The problem is controlled. Recent lipid tests were reviewed and are normal. Pertinent negatives include no shortness of breath. Current antihyperlipidemic treatment includes statins. The current treatment provides moderate improvement of lipids. Risk factors for coronary artery disease include dyslipidemia, diabetes mellitus, male sex, hypertension and a sedentary lifestyle.  Back Pain This is a chronic problem. The current episode started more than 1 year ago. The problem occurs intermittently. The problem has been waxing and waning since onset. The pain is present in the lumbar spine. The quality of the pain is described as aching. The pain is at a severity of 2/10. The pain is mild. He has tried NSAIDs for the symptoms. The treatment provided moderate relief.  ED Uses Viagra as needed and doing well. Stable.      Review of Systems  Constitutional: Negative for malaise/fatigue.  Eyes: Negative for blurred vision.  Respiratory: Negative for shortness of breath.   Musculoskeletal: Positive for back pain.     Observations/Objective: No SOB or distress noted   Assessment and Plan: Corey Cruz comes in today with chief complaint of No chief complaint on file.   Diagnosis and orders addressed:  1. Hypertension associated with diabetes ([place]) - CMP14+EGFR; Future - CBC with Differential/Platelet; Future  2. Diabetes mellitus without complication  (  [place]) - CMP14+EGFR; Future - CBC with Differential/Platelet; Future - Bayer DCA Hb A1c Waived; Future  3. Hyperlipidemia associated with type 2 diabetes mellitus (HCC) - CMP14+EGFR; Future - CBC with Differential/Platelet; Future  4. Chronic bilateral low back pain without sciatica - CMP14+EGFR; Future - CBC with Differential/Platelet; Future  5. Overweight (BMI 25.0-29.9) - CMP14+EGFR; Future - CBC with Differential/Platelet; Future  6. Erectile dysfunction, unspecified erectile dysfunction type - CMP14+EGFR; Future - CBC with Differential/Platelet; Future - sildenafil (VIAGRA) 100 MG tablet; TAKE 1/2 TO 1 (ONE-HALF TO ONE) TABLET BY MOUTH ONCE DAILY AS NEEDED FOR ERECTILE DYSFUNCTION  Dispense: 10 tablet; Refill: 6   Labs pending Health Maintenance reviewed Diet and exercise encouraged  Follow up plan: 6 months       I discussed the assessment and treatment plan with the patient. The patient was provided an opportunity to ask questions and all were answered. The patient agreed with the plan and demonstrated an understanding of the instructions.   The patient was advised to call back or seek an in-person evaluation if the symptoms worsen or if the condition fails to improve as anticipated.  The above assessment and management plan was discussed with the patient. The patient verbalized understanding of and has agreed to the management plan. Patient is aware to call the clinic if symptoms persist or worsen. Patient is aware when to return to the clinic for a follow-up visit. Patient educated on when it is appropriate to go to the emergency department.   Time call ended: 1:59 pm    I provided 18 minutes of non-face-to-face time during this encounter.    Evelina Dun, FNP

## 2019-09-09 LAB — HM DIABETES EYE EXAM

## 2019-10-12 ENCOUNTER — Other Ambulatory Visit: Payer: Self-pay

## 2019-10-12 ENCOUNTER — Other Ambulatory Visit (INDEPENDENT_AMBULATORY_CARE_PROVIDER_SITE_OTHER): Payer: Managed Care, Other (non HMO)

## 2019-10-12 DIAGNOSIS — G8929 Other chronic pain: Secondary | ICD-10-CM

## 2019-10-12 DIAGNOSIS — E119 Type 2 diabetes mellitus without complications: Secondary | ICD-10-CM

## 2019-10-12 DIAGNOSIS — E785 Hyperlipidemia, unspecified: Secondary | ICD-10-CM

## 2019-10-12 DIAGNOSIS — E663 Overweight: Secondary | ICD-10-CM

## 2019-10-12 DIAGNOSIS — N529 Male erectile dysfunction, unspecified: Secondary | ICD-10-CM

## 2019-10-12 DIAGNOSIS — E1159 Type 2 diabetes mellitus with other circulatory complications: Secondary | ICD-10-CM

## 2019-10-12 LAB — BAYER DCA HB A1C WAIVED: HB A1C (BAYER DCA - WAIVED): 7.4 % — ABNORMAL HIGH (ref <7.0)

## 2019-10-13 LAB — CBC WITH DIFFERENTIAL/PLATELET
Basophils Absolute: 0 10*3/uL (ref 0.0–0.2)
Basos: 0 %
EOS (ABSOLUTE): 0 10*3/uL (ref 0.0–0.4)
Eos: 1 %
Hematocrit: 47.2 % (ref 37.5–51.0)
Hemoglobin: 15.9 g/dL (ref 13.0–17.7)
Immature Grans (Abs): 0 10*3/uL (ref 0.0–0.1)
Immature Granulocytes: 0 %
Lymphocytes Absolute: 2.2 10*3/uL (ref 0.7–3.1)
Lymphs: 32 %
MCH: 31.3 pg (ref 26.6–33.0)
MCHC: 33.7 g/dL (ref 31.5–35.7)
MCV: 93 fL (ref 79–97)
Monocytes Absolute: 0.7 10*3/uL (ref 0.1–0.9)
Monocytes: 10 %
Neutrophils Absolute: 3.9 10*3/uL (ref 1.4–7.0)
Neutrophils: 57 %
Platelets: 298 10*3/uL (ref 150–450)
RBC: 5.08 x10E6/uL (ref 4.14–5.80)
RDW: 12.1 % (ref 11.6–15.4)
WBC: 6.8 10*3/uL (ref 3.4–10.8)

## 2019-10-13 LAB — CMP14+EGFR
ALT: 16 IU/L (ref 0–44)
AST: 15 IU/L (ref 0–40)
Albumin/Globulin Ratio: 1.9 (ref 1.2–2.2)
Albumin: 4.4 g/dL (ref 4.0–5.0)
Alkaline Phosphatase: 75 IU/L (ref 48–121)
BUN/Creatinine Ratio: 20 (ref 9–20)
BUN: 17 mg/dL (ref 6–24)
Bilirubin Total: 0.5 mg/dL (ref 0.0–1.2)
CO2: 21 mmol/L (ref 20–29)
Calcium: 9.5 mg/dL (ref 8.7–10.2)
Chloride: 104 mmol/L (ref 96–106)
Creatinine, Ser: 0.87 mg/dL (ref 0.76–1.27)
GFR calc Af Amer: 116 mL/min/{1.73_m2} (ref >59)
GFR calc non Af Amer: 101 mL/min/{1.73_m2} (ref >59)
Globulin, Total: 2.3 g/dL (ref 1.5–4.5)
Glucose: 175 mg/dL — ABNORMAL HIGH (ref 65–99)
Potassium: 4.3 mmol/L (ref 3.5–5.2)
Sodium: 139 mmol/L (ref 134–144)
Total Protein: 6.7 g/dL (ref 6.0–8.5)

## 2019-10-15 ENCOUNTER — Other Ambulatory Visit: Payer: Self-pay | Admitting: Family

## 2019-11-09 ENCOUNTER — Other Ambulatory Visit: Payer: Self-pay | Admitting: Family

## 2019-11-09 DIAGNOSIS — E785 Hyperlipidemia, unspecified: Secondary | ICD-10-CM

## 2019-11-09 DIAGNOSIS — E119 Type 2 diabetes mellitus without complications: Secondary | ICD-10-CM

## 2019-11-09 DIAGNOSIS — G8929 Other chronic pain: Secondary | ICD-10-CM

## 2019-11-09 DIAGNOSIS — I10 Essential (primary) hypertension: Secondary | ICD-10-CM

## 2019-11-22 ENCOUNTER — Telehealth: Payer: Self-pay | Admitting: Family

## 2019-11-22 NOTE — Telephone Encounter (Signed)
Pt states the fungus under his toenails is coming back and he is wanting to see if the antifungal medication he had before can be sent in. Unsure of the name of it. [facility] in [place]

## 2019-11-24 NOTE — Telephone Encounter (Signed)
Patient aware and verbalized understanding. °

## 2019-11-24 NOTE — Telephone Encounter (Signed)
Pt checking up on telephone since 11/22/2019 still no answer.

## 2019-11-24 NOTE — Telephone Encounter (Signed)
Please advise covering PCP. 

## 2019-11-24 NOTE — Telephone Encounter (Signed)
He will have to discuss this with PCP, she is back tomorrow

## 2019-11-25 NOTE — Telephone Encounter (Signed)
This will have to be discussed during his next visit or make a visit, as this medication can affect his liver enzymes and we need to check these prior.

## 2019-11-25 NOTE — Telephone Encounter (Signed)
Patient aware and verbalized understanding. °

## 2019-12-17 ENCOUNTER — Ambulatory Visit (INDEPENDENT_AMBULATORY_CARE_PROVIDER_SITE_OTHER): Payer: Managed Care, Other (non HMO) | Admitting: Family

## 2019-12-17 ENCOUNTER — Encounter: Payer: Self-pay | Admitting: Family

## 2019-12-17 DIAGNOSIS — B351 Tinea unguium: Secondary | ICD-10-CM | POA: Diagnosis not present

## 2019-12-17 MED ORDER — TERBINAFINE HCL 250 MG PO TABS: 250.0000 mg | ORAL_TABLET | Freq: Every day | ORAL | 0 refills | Status: DC

## 2019-12-17 NOTE — Progress Notes (Signed)
   Virtual Visit via telephone Note Due to COVID-19 pandemic this visit was conducted virtually. This visit type was conducted due to national recommendations for restrictions regarding the COVID-19 Pandemic (e.g. social distancing, sheltering in place) in an effort to limit this patient's exposure and mitigate transmission in our community. All issues noted in this document were discussed and addressed.  A physical exam was not performed with this format.  I connected with Corey Cruz on 12/17/19 at 3:02 pm by telephone and verified that I am speaking with the correct person using two identifiers. Corey Cruz is currently located at home and no one is currently with him  during visit. The provider, Evelina Dun, FNP is located in their office at time of visit.  I discussed the limitations, risks, security and privacy concerns of performing an evaluation and management service by telephone and the availability of in person appointments. I also discussed with the patient that there may be a patient responsible charge related to this service. The patient expressed understanding and agreed to proceed.   History and Present Illness:  HPI  PT calls the office today with right great toenail discoloration and thickness that he noticed over a month ago. It has gradually become worse. Denies any pain.   Review of Systems  All other systems reviewed and are negative.    Observations/Objective: No SOB or distress noted   Assessment and Plan: 1. Onychomycosis Will start Lamisil  CMP pending Keep clean and dry - terbinafine (LAMISIL) 250 MG tablet; Take 1 tablet (250 mg total) by mouth daily.  Dispense: 90 tablet; Refill: 0      I discussed the assessment and treatment plan with the patient. The patient was provided an opportunity to ask questions and all were answered. The patient agreed with the plan and demonstrated an understanding of the instructions.   The patient was  advised to call back or seek an in-person evaluation if the symptoms worsen or if the condition fails to improve as anticipated.  The above assessment and management plan was discussed with the patient. The patient verbalized understanding of and has agreed to the management plan. Patient is aware to call the clinic if symptoms persist or worsen. Patient is aware when to return to the clinic for a follow-up visit. Patient educated on when it is appropriate to go to the emergency department.   Time call ended:  3:10 pm   I provided 8 minutes of non-face-to-face time during this encounter.    Evelina Dun, FNP

## 2019-12-17 NOTE — Patient Instructions (Signed)

## 2020-01-05 ENCOUNTER — Telehealth: Payer: Self-pay | Admitting: Family

## 2020-01-05 DIAGNOSIS — Z1211 Encounter for screening for malignant neoplasm of colon: Secondary | ICD-10-CM

## 2020-01-05 NOTE — Telephone Encounter (Signed)
REFERRAL REQUEST Telephone Note  Have you been seen at our office for this problem? yes (Advise that they may need an appointment with their PCP before a referral can be done)  Reason for Referral: pt saw Alyse Low when covid hit and Alyse Low wanted him to do colonoscopy but he never heard anything. Pt has cpe with Je Sept 10 Referral discussed with patient: yes Best contact number of patient for referral team:   [phone] Has patient been seen by a specialist for this issue before: no Patient provider preference for referral: na Patient location preference for referral: na    Patient notified that referrals can take up to a week or longer to process. If they haven't heard anything within a week they should call back and speak with the referral department.

## 2020-01-06 NOTE — Telephone Encounter (Signed)
Referral to GI placed

## 2020-01-12 ENCOUNTER — Encounter: Payer: Self-pay | Admitting: Internal Medicine

## 2020-01-14 ENCOUNTER — Encounter: Payer: Self-pay | Admitting: Nurse Practitioner

## 2020-01-14 ENCOUNTER — Ambulatory Visit (INDEPENDENT_AMBULATORY_CARE_PROVIDER_SITE_OTHER): Payer: Managed Care, Other (non HMO) | Admitting: Nurse Practitioner

## 2020-01-14 ENCOUNTER — Other Ambulatory Visit: Payer: Self-pay

## 2020-01-14 VITALS — BP 120/81 | HR 73 | Temp 97.7°F | Resp 20 | Ht 71.0 in | Wt 202.0 lb

## 2020-01-14 DIAGNOSIS — Z0001 Encounter for general adult medical examination with abnormal findings: Secondary | ICD-10-CM

## 2020-01-14 DIAGNOSIS — E119 Type 2 diabetes mellitus without complications: Secondary | ICD-10-CM | POA: Diagnosis not present

## 2020-01-14 DIAGNOSIS — I152 Hypertension secondary to endocrine disorders: Secondary | ICD-10-CM

## 2020-01-14 DIAGNOSIS — I10 Essential (primary) hypertension: Secondary | ICD-10-CM | POA: Diagnosis not present

## 2020-01-14 DIAGNOSIS — E1159 Type 2 diabetes mellitus with other circulatory complications: Secondary | ICD-10-CM | POA: Diagnosis not present

## 2020-01-14 DIAGNOSIS — Z0000 Encounter for general adult medical examination without abnormal findings: Secondary | ICD-10-CM

## 2020-01-14 LAB — BAYER DCA HB A1C WAIVED: HB A1C (BAYER DCA - WAIVED): 6.2 % (ref <7.0)

## 2020-01-14 NOTE — Progress Notes (Signed)
Established Patient Office Visit  Subjective:  Patient ID: Corey Cruz, male    DOB: 05-20-1970  Age: 49 y.o. MRN: [id number]  CC:  Chief Complaint  Patient presents with  . Annual Exam    HPI Corey Cruz presents for.   Encounter for general adult medical examination without abnormal findings  Physical: Patient's last physical exam was 1 year ago .  Weight: Not appropriate for height (BMI greater than 27%) ; 28.17 kg/m and Blood Pressure: Normal (BP less than 120/80) ;  Medical History: Patient history reviewed ; Family history reviewed ;  Allergies Reviewed: No change in current allergies ;  Medications Reviewed: Medications reviewed - no changes ;  Lipids: Normal lipid levels ;  Smoking: Life-long non-smoker ;  Physical Activity: Exercises at least 3 times per week ;  Alcohol/Drug Use: Is a non-drinker ; No illicit drug use ;  Patient is not afflicted from Stress Incontinence and Urge Incontinence  Safety: reviewed ; Patient wears a seat belt, has smoke detectors, has carbon monoxide detectors, practices appropriate gun safety, and wears sunscreen with extended sun exposure. Dental Care: biannual cleanings, brushes and flosses daily. Ophthalmology/Optometry: Annual visit.  Hearing loss: none Vision impairments: none Past Medical History:  Diagnosis Date  . Arthritis   . Diabetes mellitus without complication ([place])   . Hyperlipidemia   . Hypertension   . Lumbar disc disorder     Past Surgical History:  Procedure Laterality Date  . HERNIA REPAIR    . INGUINAL HERNIA REPAIR Bilateral 04/16/2017   Procedure: HERNIA REPAIR INGUINAL ADULT WITH MESH;  Surgeon: Virl Cagey, MD;  Location: [facility];  Service: General;  Laterality: Bilateral;    Family History  Problem Relation Age of Onset  . Cancer Mother        lung  . Cancer Brother        leukemia    Social History   Socioeconomic History  . Marital status: Single    Spouse name: Not on  file  . Number of children: Not on file  . Years of education: Not on file  . Highest education level: Not on file  Occupational History  . Not on file  Tobacco Use  . Smoking status: Never Smoker  . Smokeless tobacco: Never Used  Vaping Use  . Vaping Use: Never used  Substance and Sexual Activity  . Alcohol use: Yes    Alcohol/week: 1.0 standard drink    Types: 1 Shots of liquor per week  . Drug use: No  . Sexual activity: Yes    Birth control/protection: None  Other Topics Concern  . Not on file  Social History Narrative  . Not on file   Social Determinants of Health                                                                         Outpatient Medications Prior to Visit  Medication Sig Dispense Refill  . aspirin EC 81 MG tablet Take 1 tablet (81 mg total) by mouth daily. 90 tablet 1  . atorvastatin (LIPITOR) 20 MG tablet Take 1 tablet by mouth once daily 90 tablet 0  . JARDIANCE 10 MG TABS tablet Take 1 tablet by  mouth once daily 90 tablet 0  . lisinopril (ZESTRIL) 10 MG tablet Take 1 tablet by mouth once daily 90 tablet 0  . meloxicam (MOBIC) 15 MG tablet Take 1 tablet by mouth once daily 90 tablet 0  . metFORMIN (GLUCOPHAGE) 1000 MG tablet TAKE 1 TABLET BY MOUTH TWICE DAILY WITH A MEAL 180 tablet 0  . sildenafil (VIAGRA) 100 MG tablet TAKE 1/2 TO 1 (ONE-HALF TO ONE) TABLET BY MOUTH ONCE DAILY AS NEEDED FOR ERECTILE DYSFUNCTION 10 tablet 6  . terbinafine (LAMISIL) 250 MG tablet Take 1 tablet (250 mg total) by mouth daily. 90 tablet 0   No facility-administered medications prior to visit.     ROS Review of Systems  Endocrine: Negative for cold intolerance, heat intolerance, polydipsia, polyphagia and polyuria.  All other systems reviewed and are negative.     Objective:    Physical Exam Constitutional:      Appearance: Normal appearance.  HENT:     Mouth/Throat:     Mouth: Mucous membranes are moist.  Eyes:      Conjunctiva/sclera: Conjunctivae normal.  Cardiovascular:     Rate and Rhythm: Normal rate and regular rhythm.     Pulses: Normal pulses.     Heart sounds: Normal heart sounds.  Pulmonary:     Effort: Pulmonary effort is normal.     Breath sounds: Normal breath sounds.  Abdominal:     General: Bowel sounds are normal.  Musculoskeletal:        General: Tenderness present.     Cervical back: Normal range of motion.     Comments: Right great toe  Skin:    General: Skin is warm.     Comments: Tattoos  Neurological:     Mental Status: He is alert and oriented to person, place, and time.  Psychiatric:        Mood and Affect: Mood normal.        Behavior: Behavior normal.     BP 120/81   Pulse 73   Temp 97.7 F (36.5 C)   Resp 20   Ht '5\' 11"'  (1.803 m)   Wt 202 lb (91.6 kg)   SpO2 94%   BMI 28.17 kg/m  Wt Readings from Last 3 Encounters:  01/14/20 202 lb (91.6 kg)  01/15/19 199 lb (90.3 kg)  08/28/18 200 lb 9.6 oz (91 kg)     Health Maintenance Due  Topic Date Due  . COVID-19 Vaccine (1) Never done  . COLONOSCOPY  Never done  . FOOT EXAM  02/03/2019      Lab Results  Component Value Date   TSH 0.977 01/15/2019   Lab Results  Component Value Date   WBC 6.8 10/12/2019   HGB 15.9 10/12/2019   HCT 47.2 10/12/2019   MCV 93 10/12/2019   PLT 298 10/12/2019   Lab Results  Component Value Date   NA 139 10/12/2019   K 4.3 10/12/2019   CO2 21 10/12/2019   GLUCOSE 175 (H) 10/12/2019   BUN 17 10/12/2019   CREATININE 0.87 10/12/2019   BILITOT 0.5 10/12/2019   ALKPHOS 75 10/12/2019   AST 15 10/12/2019   ALT 16 10/12/2019   PROT 6.7 10/12/2019   ALBUMIN 4.4 10/12/2019   CALCIUM 9.5 10/12/2019   ANIONGAP 9 04/11/2017   Lab Results  Component Value Date   CHOL 152 10/30/2018   Lab Results  Component Value Date   HDL 40 10/30/2018   Lab Results  Component Value Date  [place] 88 10/30/2018   Lab Results  Component Value Date   TRIG 120 10/30/2018    Lab Results  Component Value Date   CHOLHDL 3.8 10/30/2018   Lab Results  Component Value Date   HGBA1C 6.2 01/14/2020      Assessment & Plan:  Hypertension associated with diabetes [facility]) Patient is a 49 year old male who is in clinic for annual wellness visit. Patient has a history of diabetic induced hypertension. The patient is tolerating the medication well without side effects. Compliance with treatment has been good; including taking medication as directed , maintains a healthy diet and regular exercise regimen , and following up as directed.  Provided education to patient, advised continue healthy diet and exercise regimen, printed handouts given. Labs ordered: CBC, CMP, Follow-up in 6 months. Refill sent to pharmacy.    Diabetes mellitus without complication ([place]) Patient's diabetes mellitus type 2 is well managed on current medication regimen. No changes necessary to current dose. Completed foot exam. Patient is not reporting any signs and symptoms of hypo or hyperglycemia. Provided education to patient to continue healthy diet and exercise regimen. Printed handouts given. Completed labs: CBC, CMP  Rx refill sent to pharmacy.  Problem List Items Addressed This Visit      Cardiovascular and Mediastinum   Hypertension associated with diabetes [facility])    Other Visit Diagnoses    Annual physical exam    -  Primary   Relevant Orders   CMP14+EGFR   CBC with Differential/Platelet   Lipid panel   Bayer DCA Hb A1c Waived   PSA, total and free        Follow-up: Return in about 6 months (around 07/13/2020).    Ivy Lynn, NP

## 2020-01-14 NOTE — Assessment & Plan Note (Signed)
Patient's diabetes mellitus type 2 is well managed on current medication regimen. No changes necessary to current dose. Completed foot exam. Patient is not reporting any signs and symptoms of hypo or hyperglycemia. Provided education to patient to continue healthy diet and exercise regimen. Printed handouts given. Completed labs: CBC, CMP  Rx refill sent to pharmacy.

## 2020-01-14 NOTE — Patient Instructions (Addendum)
Hypertension, Adult Hypertension is another name for high blood pressure. High blood pressure forces your heart to work harder to pump blood. This can cause problems over time. There are two numbers in a blood pressure reading. There is a top number (systolic) over a bottom number (diastolic). It is best to have a blood pressure that is below 120/80. Healthy choices can help lower your blood pressure, or you may need medicine to help lower it. What are the causes? The cause of this condition is not known. Some conditions may be related to high blood pressure. What increases the risk? Smoking. Having type 2 diabetes mellitus, high cholesterol, or both. Not getting enough exercise or physical activity. Being overweight. Having too much fat, sugar, calories, or salt (sodium) in your diet. Drinking too much alcohol. Having long-term (chronic) kidney disease. Having a family history of high blood pressure. Age. Risk increases with age. Race. You may be at higher risk if you are African American. Gender. Men are at higher risk than women before age 45. After age 65, women are at higher risk than men. Having obstructive sleep apnea. Stress. What are the signs or symptoms? High blood pressure may not cause symptoms. Very high blood pressure (hypertensive crisis) may cause: Headache. Feelings of worry or nervousness (anxiety). Shortness of breath. Nosebleed. A feeling of being sick to your stomach (nausea). Throwing up (vomiting). Changes in how you see. Very bad chest pain. Seizures. How is this treated? This condition is treated by making healthy lifestyle changes, such as: Eating healthy foods. Exercising more. Drinking less alcohol. Your health care provider may prescribe medicine if lifestyle changes are not enough to get your blood pressure under control, and if: Your top number is above 130. Your bottom number is above 80. Your personal target blood pressure may vary. Follow  these instructions at home: Eating and drinking  If told, follow the DASH eating plan. To follow this plan: Fill one half of your plate at each meal with fruits and vegetables. Fill one fourth of your plate at each meal with whole grains. Whole grains include whole-wheat pasta, brown rice, and whole-grain bread. Eat or drink low-fat dairy products, such as skim milk or low-fat yogurt. Fill one fourth of your plate at each meal with low-fat (lean) proteins. Low-fat proteins include fish, chicken without skin, eggs, beans, and tofu. Avoid fatty meat, cured and processed meat, or chicken with skin. Avoid pre-made or processed food. Eat less than 1,500 mg of salt each day. Do not drink alcohol if: Your doctor tells you not to drink. You are pregnant, may be pregnant, or are planning to become pregnant. If you drink alcohol: Limit how much you use to: 0-1 drink a day for women. 0-2 drinks a day for men. Be aware of how much alcohol is in your drink. In the [place] one drink equals one 12 oz bottle of beer (355 mL), one 5 oz glass of wine (148 mL), or one 1 oz glass of hard liquor (44 mL). Lifestyle  Work with your doctor to stay at a healthy weight or to lose weight. Ask your doctor what the best weight is for you. Get at least 30 minutes of exercise most days of the week. This may include walking, swimming, or biking. Get at least 30 minutes of exercise that strengthens your muscles (resistance exercise) at least 3 days a week. This may include lifting weights or doing Pilates. Do not use any products that contain nicotine or tobacco, such   as cigarettes, e-cigarettes, and chewing tobacco. If you need help quitting, ask your doctor. Check your blood pressure at home as told by your doctor. Keep all follow-up visits as told by your doctor. This is important. Medicines Take over-the-counter and prescription medicines only as told by your doctor. Follow directions carefully. Do not skip doses of  blood pressure medicine. The medicine does not work as well if you skip doses. Skipping doses also puts you at risk for problems. Ask your doctor about side effects or reactions to medicines that you should watch for. Contact a doctor if you: Think you are having a reaction to the medicine you are taking. Have headaches that keep coming back (recurring). Feel dizzy. Have swelling in your ankles. Have trouble with your vision. Get help right away if you: Get a very bad headache. Start to feel mixed up (confused). Feel weak or numb. Feel faint. Have very bad pain in your: Chest. Belly (abdomen). Throw up more than once. Have trouble breathing. Summary Hypertension is another name for high blood pressure. High blood pressure forces your heart to work harder to pump blood. For most people, a normal blood pressure is less than 120/80. Making healthy choices can help lower blood pressure. If your blood pressure does not get lower with healthy choices, you may need to take medicine. This information is not intended to replace advice given to you by your health care provider. Make sure you discuss any questions you have with your health care provider. Document Revised: 12/31/2017 Document Reviewed: 12/31/2017 Elsevier Patient Education  2020 [place], Adult Foot care is an important part of your health. Noticing and addressing any potential problems early is the best way to prevent future foot problems. How to care for your [place] your feet daily with warm water and mild soap. Do not use hot water. Then, pat your feet and the areas between your toes until they are completely dry. Do not soak your feet as this can dry your skin.  Trim your toenails straight across. Do not dig under them or around the cuticle. File the edges of your nails with an emery board or nail file.  On the skin on your feet and on dry, brittle nails, apply a moisturizing lotion or  petroleum jelly that is unscented and does not contain alcohol. Do not apply lotion between your toes. Shoes and Socks   Wear clean socks or stockings every day. Make sure they are not too tight.  Wear shoes that fit properly and have enough cushioning. To break in new shoes, wear them for just a few hours a day. This prevents you from injuring your feet. Always look in your shoes before you put them on to be sure there are no objects inside. Wounds, Scrapes, Corns, and Calluses  Check your feet daily for blisters, cuts, and redness. If you cannot see the bottom of your feet, use a mirror or ask someone for help.  Do not cut corns or calluses. Do not try to remove them with medicine.  If you find a minor scrape, cut, or break in the skin on your feet, keep it and the skin around it clean and dry. These areas may be cleaned with mild soap and water. Do not clean the area with peroxide, alcohol, or iodine.  If you have a wound, scrape, corn, or callus on your foot, look at it several times a day to make sure it is healing and  is not infected. Check for: ? More redness, swelling, or pain. ? More fluid or blood. ? Warmth. ? Pus or a bad smell. General Instructions  Do not cross your legs. That may decrease the blood flow to your feet.  Do not use heating pads or hot water bottles on your feet. They may burn your skin. If you have lost feeling in your feet or legs, you may not know it is happening until it is too late.  Make sure your health care provider does a complete foot exam at least annually or more often if you have foot problems. If you have foot problems, report any cuts, sores, or bruises to your health care provider immediately. Contact a health care provider if:  You have a medical condition that increases your risk of infection and you have any cuts, sores, or bruises on your feet.  You have an injury that is not healing.  You notice redness on your legs or feet.  You feel  burning or tingling in your legs or feet.  You have pain or cramps in your legs or feet.  Your legs or feet are numb.  Your feet always feel cold.  You have pain around a toenail. Get help right away if:  You have a wound, scrape, corn, or callus on your foot and: ? You have more redness, swelling, or pain. ? You have more fluid or blood. ? Your wound, scrape, corn, or callus feels warm to the touch. ? You have pus or a bad smell coming from the wound, scrape, corn, or callus. ? You have a fever.  You have a red line going up your leg. This information is not intended to replace advice given to you by your health care provider. Make sure you discuss any questions you have with your health care provider. Document Revised: 05/09/2016 Document Reviewed: 09/29/2015 Elsevier Patient Education  [place]. High Cholesterol  High cholesterol is a condition in which the blood has high levels of a white, waxy, fat-like substance (cholesterol). The human body needs small amounts of cholesterol. The liver makes all the cholesterol that the body needs. Extra (excess) cholesterol comes from the food that we eat. Cholesterol is carried from the liver by the blood through the blood vessels. If you have high cholesterol, deposits (plaques) may build up on the walls of your blood vessels (arteries). Plaques make the arteries narrower and stiffer. Cholesterol plaques increase your risk for heart attack and stroke. Work with your health care provider to keep your cholesterol levels in a healthy range. What increases the risk? This condition is more likely to develop in people who:  Eat foods that are high in animal fat (saturated fat) or cholesterol.  Are overweight.  Are not getting enough exercise.  Have a family history of high cholesterol. What are the signs or symptoms? There are no symptoms of this condition. How is this diagnosed? This condition may be diagnosed from the results of  a blood test.  If you are older than age 43, your health care provider may check your cholesterol every 4-6 years.  You may be checked more often if you already have high cholesterol or other risk factors for heart disease. The blood test for cholesterol measures:  "Bad" cholesterol (LDL cholesterol). This is the main type of cholesterol that causes heart disease. The desired level for LDL is less than 100.  "Good" cholesterol (HDL cholesterol). This type helps to protect against heart disease by  cleaning the arteries and carrying the LDL away. The desired level for HDL is 60 or higher.  Triglycerides. These are fats that the body can store or burn for energy. The desired number for triglycerides is lower than 150.  Total cholesterol. This is a measure of the total amount of cholesterol in your blood, including LDL cholesterol, HDL cholesterol, and triglycerides. A healthy number is less than 200. How is this treated? This condition is treated with diet changes, lifestyle changes, and medicines. Diet changes  This may include eating more whole grains, fruits, vegetables, nuts, and fish.  This may also include cutting back on red meat and foods that have a lot of added sugar. Lifestyle changes  Changes may include getting at least 40 minutes of aerobic exercise 3 times a week. Aerobic exercises include walking, biking, and swimming. Aerobic exercise along with a healthy diet can help you maintain a healthy weight.  Changes may also include quitting smoking. Medicines  Medicines are usually given if diet and lifestyle changes have failed to reduce your cholesterol to healthy levels.  Your health care provider may prescribe a statin medicine. Statin medicines have been shown to reduce cholesterol, which can reduce the risk of heart disease. Follow these instructions at home: Eating and drinking If told by your health care provider:  Eat chicken (without skin), fish, veal, shellfish,  ground [place] breast, and round or loin cuts of red meat.  Do not eat fried foods or fatty meats, such as hot dogs and salami.  Eat plenty of fruits, such as apples.  Eat plenty of vegetables, such as broccoli, potatoes, and carrots.  Eat beans, peas, and lentils.  Eat grains such as barley, rice, couscous, and bulgur wheat.  Eat pasta without cream sauces.  Use skim or nonfat milk, and eat low-fat or nonfat yogurt and cheeses.  Do not eat or drink whole milk, cream, ice cream, egg yolks, or hard cheeses.  Do not eat stick margarine or tub margarines that contain trans fats (also called partially hydrogenated oils).  Do not eat saturated tropical oils, such as coconut oil and palm oil.  Do not eat cakes, cookies, crackers, or other baked goods that contain trans fats.  General instructions  Exercise as directed by your health care provider. Increase your activity level with activities such as gardening, walking, and taking the stairs.  Take over-the-counter and prescription medicines only as told by your health care provider.  Do not use any products that contain nicotine or tobacco, such as cigarettes and e-cigarettes. If you need help quitting, ask your health care provider.  Keep all follow-up visits as told by your health care provider. This is important. Contact a health care provider if:  You are struggling to maintain a healthy diet or weight.  You need help to start on an exercise program.  You need help to stop smoking. Get help right away if:  You have chest pain.  You have trouble breathing. This information is not intended to replace advice given to you by your health care provider. Make sure you discuss any questions you have with your health care provider. Document Revised: 04/25/2017 Document Reviewed: 10/21/2015 Elsevier Patient Education  [place].

## 2020-01-14 NOTE — Assessment & Plan Note (Signed)
Patient is a 49 year old male who is in clinic for annual wellness visit. Patient has a history of diabetic induced hypertension. The patient is tolerating the medication well without side effects. Compliance with treatment has been good; including taking medication as directed , maintains a healthy diet and regular exercise regimen , and following up as directed.  Provided education to patient, advised continue healthy diet and exercise regimen, printed handouts given. Labs ordered: CBC, CMP, Follow-up in 6 months. Refill sent to pharmacy.

## 2020-01-15 LAB — CMP14+EGFR
ALT: 24 IU/L (ref 0–44)
AST: 21 IU/L (ref 0–40)
Albumin/Globulin Ratio: 1.9 (ref 1.2–2.2)
Albumin: 4.7 g/dL (ref 3.8–4.9)
Alkaline Phosphatase: 66 IU/L (ref 48–121)
BUN/Creatinine Ratio: 20 (ref 9–20)
BUN: 16 mg/dL (ref 6–24)
Bilirubin Total: 0.6 mg/dL (ref 0.0–1.2)
CO2: 24 mmol/L (ref 20–29)
Calcium: 9.6 mg/dL (ref 8.7–10.2)
Chloride: 98 mmol/L (ref 96–106)
Creatinine, Ser: 0.81 mg/dL (ref 0.76–1.27)
GFR calc Af Amer: 119 mL/min/{1.73_m2} (ref >59)
GFR calc non Af Amer: 103 mL/min/{1.73_m2} (ref >59)
Globulin, Total: 2.5 g/dL (ref 1.5–4.5)
Glucose: 125 mg/dL — ABNORMAL HIGH (ref 65–99)
Potassium: 4 mmol/L (ref 3.5–5.2)
Sodium: 133 mmol/L — ABNORMAL LOW (ref 134–144)
Total Protein: 7.2 g/dL (ref 6.0–8.5)

## 2020-01-15 LAB — CBC WITH DIFFERENTIAL/PLATELET
Basophils Absolute: 0 10*3/uL (ref 0.0–0.2)
Basos: 1 %
EOS (ABSOLUTE): 0 10*3/uL (ref 0.0–0.4)
Eos: 1 %
Hematocrit: 46.7 % (ref 37.5–51.0)
Hemoglobin: 16.4 g/dL (ref 13.0–17.7)
Immature Grans (Abs): 0 10*3/uL (ref 0.0–0.1)
Immature Granulocytes: 0 %
Lymphocytes Absolute: 2.3 10*3/uL (ref 0.7–3.1)
Lymphs: 37 %
MCH: 32.2 pg (ref 26.6–33.0)
MCHC: 35.1 g/dL (ref 31.5–35.7)
MCV: 92 fL (ref 79–97)
Monocytes Absolute: 0.7 10*3/uL (ref 0.1–0.9)
Monocytes: 11 %
Neutrophils Absolute: 3.3 10*3/uL (ref 1.4–7.0)
Neutrophils: 50 %
Platelets: 277 10*3/uL (ref 150–450)
RBC: 5.1 x10E6/uL (ref 4.14–5.80)
RDW: 12.3 % (ref 11.6–15.4)
WBC: 6.4 10*3/uL (ref 3.4–10.8)

## 2020-01-15 LAB — LIPID PANEL
Chol/HDL Ratio: 3.9 ratio (ref 0.0–5.0)
Cholesterol, Total: 157 mg/dL (ref 100–199)
HDL: 40 mg/dL (ref >39)
LDL Chol Calc (NIH): 93 mg/dL (ref 0–99)
Triglycerides: 133 mg/dL (ref 0–149)
VLDL Cholesterol Cal: 24 mg/dL (ref 5–40)

## 2020-01-15 LAB — PSA, TOTAL AND FREE
PSA, Free Pct: 30 %
PSA, Free: 0.06 ng/mL
Prostate Specific Ag, Serum: 0.2 ng/mL (ref 0.0–4.0)

## 2020-01-18 ENCOUNTER — Telehealth: Payer: Self-pay

## 2020-01-18 NOTE — Telephone Encounter (Signed)
Pt wants to r/s his nurse visit. Pt will need a Monday or Friday due to working out of town. Per pt, can leave a voice mail if needed.

## 2020-01-18 NOTE — Telephone Encounter (Signed)
Called pt and he rescheduled his nurse visit to 03/13/2020 at 8:00.

## 2020-01-21 ENCOUNTER — Other Ambulatory Visit: Payer: Self-pay | Admitting: Family

## 2020-01-21 DIAGNOSIS — E785 Hyperlipidemia, unspecified: Secondary | ICD-10-CM

## 2020-01-21 DIAGNOSIS — M545 Low back pain, unspecified: Secondary | ICD-10-CM

## 2020-01-21 DIAGNOSIS — I10 Essential (primary) hypertension: Secondary | ICD-10-CM

## 2020-01-21 DIAGNOSIS — E119 Type 2 diabetes mellitus without complications: Secondary | ICD-10-CM

## 2020-03-02 ENCOUNTER — Telehealth: Payer: Self-pay | Admitting: Internal Medicine

## 2020-03-02 NOTE — Telephone Encounter (Signed)
PATIENT CALLED STATING SOMEONE CALLED HIM FROM HERE, PLEASE CALL BACK

## 2020-03-02 NOTE — Telephone Encounter (Signed)
Called pt back.  Discussed nurse visit with pt and he requested procedure date and Covid screening.  He requested me to save him a spot so it could be done before end of year.

## 2020-03-07 ENCOUNTER — Ambulatory Visit: Payer: Managed Care, Other (non HMO)

## 2020-03-13 ENCOUNTER — Ambulatory Visit (INDEPENDENT_AMBULATORY_CARE_PROVIDER_SITE_OTHER): Payer: Self-pay | Admitting: *Deleted

## 2020-03-13 DIAGNOSIS — Z1211 Encounter for screening for malignant neoplasm of colon: Secondary | ICD-10-CM

## 2020-03-13 MED ORDER — NA SULFATE-K SULFATE-MG SULF 17.5-3.13-1.6 GM/177ML PO SOLN: 1.0000 | Freq: Once | ORAL | 0 refills | Status: AC

## 2020-03-13 NOTE — Progress Notes (Signed)
Gastroenterology Pre-Procedure Review  Request Date: 03/13/2020 Requesting Physician: Evelina Dun, NP @ [facility], no previous TCS   PATIENT REVIEW QUESTIONS: The patient responded to the following health history questions as indicated:    1. Diabetes Melitis: yes, type II 2. Joint replacements in the past 12 months: no 3. Major health problems in the past 3 months: no 4. Has an artificial valve or MVP: no 5. Has a defibrillator: no 6. Has been advised in past to take antibiotics in advance of a procedure like teeth cleaning: no 7. Family history of colon cancer: no  8. Alcohol Use: yes, 1 beer a month 9. Illicit drug Use: no 10. History of sleep apnea: no  11. History of coronary artery or other vascular stents placed within the last 12 months: no 12. History of any prior anesthesia complications: no 13. There is no height or weight on file to calculate BMI. ht: 5'10 wt: 199 lbs    MEDICATIONS & ALLERGIES:    Patient reports the following regarding taking any blood thinners:   Plavix? no Aspirin? yes Coumadin? no Brilinta? no Xarelto? no Eliquis? no Pradaxa? no Savaysa? no Effient? no  Patient confirms/reports the following medications:  Current Outpatient Medications  Medication Sig Dispense Refill  . acetaminophen (TYLENOL) 500 MG tablet Take 500 mg by mouth as needed.    Marland Kitchen aspirin EC 81 MG tablet Take 1 tablet (81 mg total) by mouth daily. 90 tablet 1  . atorvastatin (LIPITOR) 20 MG tablet Take 1 tablet by mouth once daily 90 tablet 1  . JARDIANCE 10 MG TABS tablet Take 1 tablet by mouth once daily 90 tablet 1  . lisinopril (ZESTRIL) 10 MG tablet Take 1 tablet by mouth once daily 90 tablet 1  . meloxicam (MOBIC) 15 MG tablet Take 1 tablet by mouth once daily 90 tablet 1  . metFORMIN (GLUCOPHAGE) 1000 MG tablet TAKE 1 TABLET BY MOUTH TWICE DAILY WITH A MEAL 180 tablet 1  . sildenafil (VIAGRA) 100 MG tablet TAKE 1/2 TO 1 (ONE-HALF TO ONE) TABLET BY MOUTH ONCE DAILY AS NEEDED  FOR ERECTILE DYSFUNCTION 10 tablet 6  . terbinafine (LAMISIL) 250 MG tablet Take 1 tablet (250 mg total) by mouth daily. 90 tablet 0   No current facility-administered medications for this visit.    Patient confirms/reports the following allergies:  No Known Allergies  No orders of the defined types were placed in this encounter.   AUTHORIZATION INFORMATION Primary Insurance: Unknown Jim #: [id number]  Group #: [id number] Pre-Cert / Josem Kaufmann required: No, not required  SCHEDULE INFORMATION: Procedure has been scheduled as follows:  Date: 03/27/2020, Time: 10:45 Location: [facility] with Dr. Abbey Chatters  This Gastroenterology Pre-Precedure Review Form is being routed to the following provider(s): Walden Field, NP

## 2020-03-13 NOTE — Patient Instructions (Addendum)
Corey Cruz  [birth year] MRN: [id number]     Procedure Date: 03/27/2020 Time to register: 9:30 am Place to register: [facility] Procedure Time: 11:00 am Scheduled provider: Dr. Abbey Chatters    PREPARATION FOR COLONOSCOPY WITH SUPREP BOWEL PREP KIT  Note: Suprep Bowel Prep Kit is a split-dose (2day) regimen. Consumption of BOTH 6-ounce bottles is required for a complete prep.  Please notify us immediately if you are diabetic, take iron supplements, or if you are on Coumadin or any other blood thinners.  Please hold the following medications: See letter                                                                                                                                                  2 DAYS BEFORE PROCEDURE:  DATE: 03/25/2020 DAY: Saturday Begin clear liquid diet AFTER your lunch meal. NO SOLID FOODS after this point.  1 DAY BEFORE PROCEDURE:  DATE: 03/26/2020  DAY: Sunday Continue clear liquids the entire day - NO SOLID FOOD.   Diabetic medications adjustments for today: See letter  At 6:00pm: Complete steps 1 through 4 below, using ONE (1) 6-ounce bottle, before going to bed. Step 1:  Pour ONE (1) 6-ounce bottle of SUPREP liquid into the mixing container.  Step 2:  Add cool drinking water to the 16 ounce line on the container and mix.  Note: Dilute the solution concentrate as directed prior to use. Step 3:  DRINK ALL the liquid in the container. Step 4:  You MUST drink an additional two (2) or more 16 ounce containers of water over the next one (1) hour.   Continue clear liquids.  DAY OF PROCEDURE:   DATE: 03/27/2020   DAY: Monday If you take medications for your heart, blood pressure, or breathing, you may take these medications.  Diabetic medications adjustments for today: See letter  5 hours before your procedure at :  6:00 am Step 1:  Pour ONE (1) 6-ounce bottle of SUPREP liquid into the mixing container.  Step 2:  Add cool drinking water to the  16 ounce line on the container and mix.  Note: Dilute the solution concentrate as directed prior to use. Step 3:  DRINK ALL the liquid in the container. Step 4:  You MUST drink an additional two (2) or more 16 ounce containers of water over the next one (1) hour. You MUST complete the final glass of water at least 3 hours before your colonoscopy. Nothing by mouth past 8:00 am.  You may take your morning medications with sip of water unless we have instructed otherwise.    Please see below for Dietary Information.  CLEAR LIQUIDS INCLUDE:  Water Jello (NOT red in color)   Ice Popsicles (NOT red in color)   Tea (sugar ok, no milk/cream) Powdered fruit flavored drinks  Coffee (sugar  ok, no milk/cream) Gatorade/ Lemonade/ Kool-Aid  (NOT red in color)   Juice: apple, white grape, white cranberry Soft drinks  Clear bullion, consomme, broth (fat free beef/chicken/vegetable)  Carbonated beverages (any kind)  Strained chicken noodle soup Hard Candy   Remember: Clear liquids are liquids that will allow you to see your fingers on the other side of a clear glass. Be sure liquids are NOT red in color, and not cloudy, but CLEAR.  DO NOT EAT OR DRINK ANY OF THE FOLLOWING:  Dairy products of any kind   Cranberry juice Tomato juice / V8 juice   Grapefruit juice Orange juice     Red grape juice  Do not eat any solid foods, including such foods as: cereal, oatmeal, yogurt, fruits, vegetables, creamed soups, eggs, bread, crackers, pureed foods in a blender, etc.   HELPFUL HINTS FOR DRINKING PREP SOLUTION:   Make sure prep is extremely cold. Mix and refrigerate the the morning of the prep. You may also put in the freezer.   You may try mixing some Crystal Light or Country Time Lemonade if you prefer. Mix in small amounts; add more if necessary.  Try drinking through a straw  Rinse mouth with water or a mouthwash between glasses, to remove after-taste.  Try sipping on a cold beverage /ice/ popsicles  between glasses of prep.  Place a piece of sugar-free hard candy in mouth between glasses.  If you become nauseated, try consuming smaller amounts, or stretch out the time between glasses. Stop for 30-60 minutes, then slowly start back drinking.     OTHER INSTRUCTIONS  You will need a responsible adult at least 51 years of age to accompany you and drive you home. This person must remain in the waiting room during your procedure. The hospital will cancel your procedure if you do not have a responsible adult with you.   1. Wear loose fitting clothing that is easily removed. 2. Leave jewelry and other valuables at home.  3. Remove all body piercing jewelry and leave at home. 4. Total time from sign-in until discharge is approximately 2-3 hours. 5. You should go home directly after your procedure and rest. You can resume normal activities the day after your procedure. 6. The day of your procedure you should not:  Drive  Make legal decisions  Operate machinery  Drink alcohol  Return to work   You may call the office (Dept: [phone]) before 5:00pm, or page the doctor on call [phone]) after 5:00pm, for further instructions, if necessary.   Insurance Information YOU WILL NEED TO CHECK WITH YOUR INSURANCE COMPANY FOR THE BENEFITS OF COVERAGE YOU HAVE FOR THIS PROCEDURE.  UNFORTUNATELY, NOT ALL INSURANCE COMPANIES HAVE BENEFITS TO COVER ALL OR PART OF THESE TYPES OF PROCEDURES.  IT IS YOUR RESPONSIBILITY TO CHECK YOUR BENEFITS, HOWEVER, WE WILL BE GLAD TO ASSIST YOU WITH ANY CODES YOUR INSURANCE COMPANY MAY NEED.    PLEASE NOTE THAT MOST INSURANCE COMPANIES WILL NOT COVER A SCREENING COLONOSCOPY FOR PEOPLE UNDER THE AGE OF 50  IF YOU HAVE BCBS INSURANCE, YOU MAY HAVE BENEFITS FOR A SCREENING COLONOSCOPY BUT IF POLYPS ARE FOUND THE DIAGNOSIS WILL CHANGE AND THEN YOU MAY HAVE A DEDUCTIBLE THAT WILL NEED TO BE MET. SO PLEASE MAKE SURE YOU CHECK YOUR BENEFITS FOR A SCREENING  COLONOSCOPY AS WELL AS A DIAGNOSTIC COLONOSCOPY.

## 2020-03-16 ENCOUNTER — Telehealth: Payer: Self-pay | Admitting: Internal Medicine

## 2020-03-16 NOTE — Telephone Encounter (Signed)
Pt is scheduled for a covid test on 11/19 but he isn't scheduled for a procedure. Please call him at [phone]

## 2020-03-16 NOTE — Telephone Encounter (Signed)
Called pt back and informed him that he is on the schedule for his procedure on 03/27/2020.  He was informed that the orders just aren't in yet.  Our provider needs to review first. Will mail out prep instructions once approved.

## 2020-03-17 ENCOUNTER — Encounter: Payer: Self-pay | Admitting: *Deleted

## 2020-03-17 NOTE — Progress Notes (Signed)
Mailed letter to pt with medication adjustments.  Entered special orders in Epic for BMP and to check CBG per Walden Field, NP.

## 2020-03-17 NOTE — Progress Notes (Signed)
Ok to schedule.  Glucophage and Jardiance: none morning of. HOLD VIAGRA for 48 hours prior to procedure  On prep day: Check CBG ac and hs as well (if they normally check their blood sugar) as if the patient feels like their blood sugar is off. Can use soda, juice (that's in [place]) as needed for any low blood sugar.  Check CBG on arrival to endo unit.  ASA II (Will need BMP prior to procedure due to DM)

## 2020-03-21 ENCOUNTER — Encounter (HOSPITAL_COMMUNITY): Payer: Self-pay

## 2020-03-24 ENCOUNTER — Other Ambulatory Visit: Payer: Self-pay

## 2020-03-24 ENCOUNTER — Other Ambulatory Visit (HOSPITAL_COMMUNITY)
Admission: RE | Admit: 2020-03-24 | Discharge: 2020-03-24 | Disposition: A | Discharge status: Home / Self Care | Payer: Managed Care, Other (non HMO) | Source: Ambulatory Visit | Attending: Family | Admitting: Family

## 2020-03-24 DIAGNOSIS — Z20822 Contact with and (suspected) exposure to covid-19: Secondary | ICD-10-CM | POA: Insufficient documentation

## 2020-03-24 DIAGNOSIS — Z01812 Encounter for preprocedural laboratory examination: Secondary | ICD-10-CM | POA: Insufficient documentation

## 2020-03-25 LAB — SARS CORONAVIRUS 2 (TAT 6-24 HRS): SARS Coronavirus 2: NEGATIVE

## 2020-03-27 ENCOUNTER — Ambulatory Visit (HOSPITAL_COMMUNITY): Payer: Managed Care, Other (non HMO) | Admitting: Anesthesiology

## 2020-03-27 ENCOUNTER — Encounter (HOSPITAL_COMMUNITY): Payer: Self-pay

## 2020-03-27 ENCOUNTER — Ambulatory Visit (HOSPITAL_COMMUNITY)
Admission: RE | Admit: 2020-03-27 | Discharge: 2020-03-27 | Disposition: A | Discharge status: Home / Self Care | Payer: Managed Care, Other (non HMO) | Attending: Internal Medicine | Admitting: Internal Medicine

## 2020-03-27 ENCOUNTER — Other Ambulatory Visit: Payer: Self-pay

## 2020-03-27 ENCOUNTER — Encounter (HOSPITAL_COMMUNITY)
Admission: RE | Disposition: A | Discharge status: Home / Self Care | Payer: Self-pay | Source: Home / Self Care | Attending: Internal Medicine

## 2020-03-27 DIAGNOSIS — Z791 Long term (current) use of non-steroidal anti-inflammatories (NSAID): Secondary | ICD-10-CM | POA: Diagnosis not present

## 2020-03-27 DIAGNOSIS — Z79899 Other long term (current) drug therapy: Secondary | ICD-10-CM | POA: Insufficient documentation

## 2020-03-27 DIAGNOSIS — Z7984 Long term (current) use of oral hypoglycemic drugs: Secondary | ICD-10-CM | POA: Diagnosis not present

## 2020-03-27 DIAGNOSIS — Z7982 Long term (current) use of aspirin: Secondary | ICD-10-CM | POA: Insufficient documentation

## 2020-03-27 DIAGNOSIS — Z1211 Encounter for screening for malignant neoplasm of colon: Secondary | ICD-10-CM

## 2020-03-27 DIAGNOSIS — K648 Other hemorrhoids: Secondary | ICD-10-CM | POA: Diagnosis not present

## 2020-03-27 HISTORY — PX: COLONOSCOPY WITH PROPOFOL: SHX5780

## 2020-03-27 LAB — GLUCOSE, CAPILLARY: Glucose-Capillary: 96 mg/dL (ref 70–99)

## 2020-03-27 SURGERY — COLONOSCOPY WITH PROPOFOL
Anesthesia: General

## 2020-03-27 MED ORDER — LACTATED RINGERS IV SOLN: INTRAVENOUS | Status: DC | PRN

## 2020-03-27 MED ORDER — PROPOFOL 10 MG/ML IV BOLUS
INTRAVENOUS | Status: DC | PRN
  Administered 2020-03-27: 150 ug/kg/min via INTRAVENOUS
  Administered 2020-03-27: 100 mg via INTRAVENOUS

## 2020-03-27 MED ORDER — CHLORHEXIDINE GLUCONATE CLOTH 2 % EX PADS: 6.0000 | MEDICATED_PAD | Freq: Once | CUTANEOUS | Status: DC

## 2020-03-27 MED ORDER — LACTATED RINGERS IV SOLN: Freq: Once | INTRAVENOUS | Status: AC

## 2020-03-27 NOTE — Discharge Instructions (Addendum)
Colonoscopy Discharge Instructions  Read the instructions outlined below and refer to this sheet in the next few weeks. These discharge instructions provide you with general information on caring for yourself after you leave the hospital. Your doctor may also give you specific instructions. While your treatment has been planned according to the most current medical practices available, unavoidable complications occasionally occur.   ACTIVITY  You may resume your regular activity, but move at a slower pace for the next 24 hours.   Take frequent rest periods for the next 24 hours.   Walking will help get rid of the air and reduce the bloated feeling in your belly (abdomen).   No driving for 24 hours (because of the medicine (anesthesia) used during the test).    Do not sign any important legal documents or operate any machinery for 24 hours (because of the anesthesia used during the test).  NUTRITION  Drink plenty of fluids.   You may resume your normal diet as instructed by your doctor.   Begin with a light meal and progress to your normal diet. Heavy or fried foods are harder to digest and may make you feel sick to your stomach (nauseated).   Avoid alcoholic beverages for 24 hours or as instructed.  MEDICATIONS  You may resume your normal medications unless your doctor tells you otherwise.  WHAT YOU CAN EXPECT TODAY  Some feelings of bloating in the abdomen.   Passage of more gas than usual.   Spotting of blood in your stool or on the toilet paper.  IF YOU HAD POLYPS REMOVED DURING THE COLONOSCOPY:  No aspirin products for 7 days or as instructed.   No alcohol for 7 days or as instructed.   Eat a soft diet for the next 24 hours.  FINDING OUT THE RESULTS OF YOUR TEST Not all test results are available during your visit. If your test results are not back during the visit, make an appointment with your caregiver to find out the results. Do not assume everything is normal if  you have not heard from your caregiver or the medical facility. It is important for you to follow up on all of your test results.  SEEK IMMEDIATE MEDICAL ATTENTION IF:  You have more than a spotting of blood in your stool.   Your belly is swollen (abdominal distention).   You are nauseated or vomiting.   You have a temperature over 101.   You have abdominal pain or discomfort that is severe or gets worse throughout the day.   Your colonoscopy was relatively unremarkable besides internal hemorrhoids.  I did not find any polyps or evidence of colon cancer.  I recommend we repeat colonoscopy in 10 years for screening purposes.  Follow-up with GI as needed.  I hope you have a great rest of your week!  Elon Alas. Abbey Chatters, D.O. Gastroenterology and Hepatology [facility]  PATIENT INSTRUCTIONS POST-ANESTHESIA  IMMEDIATELY FOLLOWING SURGERY:  Do not drive or operate machinery for the first twenty four hours after surgery.  Do not make any important decisions for twenty four hours after surgery or while taking narcotic pain medications or sedatives.  If you develop intractable nausea and vomiting or a severe headache please notify your doctor immediately.  FOLLOW-UP:  Please make an appointment with your surgeon as instructed. You do not need to follow up with anesthesia unless specifically instructed to do so.  WOUND CARE INSTRUCTIONS (if applicable):  Keep a dry clean dressing on the anesthesia/puncture  wound site if there is drainage.  Once the wound has quit draining you may leave it open to air.  Generally you should leave the bandage intact for twenty four hours unless there is drainage.  If the epidural site drains for more than 36-48 hours please call the anesthesia department.  QUESTIONS?:  Please feel free to call your physician or the hospital operator if you have any questions, and they will be happy to assist you.

## 2020-03-27 NOTE — Anesthesia Preprocedure Evaluation (Signed)
Anesthesia Evaluation  Patient identified by MRN, date of birth, ID band Patient awake    Reviewed: Allergy & Precautions, NPO status , Patient's Chart, lab work & pertinent test results  History of Anesthesia Complications Negative for: history of anesthetic complications  Airway Mallampati: II  TM Distance: >3 FB Neck ROM: Full    Dental  (+) Dental Advisory Given Crown :   Pulmonary    Pulmonary exam normal breath sounds clear to auscultation       Cardiovascular Exercise Tolerance: Good hypertension, Pt. on medications Normal cardiovascular exam Rhythm:Regular Rate:Normal     Neuro/Psych  Neuromuscular disease negative psych ROS   GI/Hepatic negative GI ROS, Neg liver ROS,   Endo/Other  diabetes, Well Controlled, Type 2, Oral Hypoglycemic Agents  Renal/GU negative Renal ROS     Musculoskeletal  (+) Arthritis  (back pain, cant bend his big toe),   Abdominal   Peds  Hematology   Anesthesia Other Findings   Reproductive/Obstetrics                             Anesthesia Physical Anesthesia Plan  ASA: II  Anesthesia Plan: General   Post-op Pain Management:    Induction: Intravenous  PONV Risk Score and Plan: TIVA  Airway Management Planned: Nasal Cannula and Natural Airway  Additional Equipment:   Intra-op Plan:   Post-operative Plan:   Informed Consent: I have reviewed the patients History and Physical, chart, labs and discussed the procedure including the risks, benefits and alternatives for the proposed anesthesia with the patient or authorized representative who has indicated his/her understanding and acceptance.     Dental advisory given  Plan Discussed with: CRNA and Surgeon  Anesthesia Plan Comments:         Anesthesia Quick Evaluation

## 2020-03-27 NOTE — Op Note (Signed)
[facility] Patient Name: Corey Cruz Procedure Date: 03/27/2020 11:06 AM MRN: [id number] Date of Birth: 1970-12-29 Attending MD: Elon Alas. Edgar Frisk CSN: [id number] Age: 49 Admit Type: Outpatient Procedure:                Colonoscopy Indications:              Screening for colorectal malignant neoplasm Providers:                Elon Alas. Nessa Ramaker, DO, Otis Peak B. Sharon Seller, RN,                            Nelma Rothman, Technician Referring MD:              Medicines:                See the Anesthesia note for documentation of the                            administered medications Complications:            No immediate complications. Estimated Blood Loss:     Estimated blood loss: none. Procedure:                Pre-Anesthesia Assessment:                           - The anesthesia plan was to use monitored                            anesthesia care (MAC).                           After obtaining informed consent, the colonoscope                            was passed under direct vision. Throughout the                            procedure, the patient's blood pressure, pulse, and                            oxygen saturations were monitored continuously. The                            PCF-[id number] ([id number]) was introduced through the                            anus and advanced to the the cecum, identified by                            appendiceal orifice and ileocecal valve. The                            colonoscopy was performed without difficulty. The                            patient tolerated the procedure well. The  quality                            of the bowel preparation was evaluated using the                            BBPS [facility] Bowel Preparation Scale) with scores                            of: Right Colon = 2 (minor amount of residual                            staining, small fragments of stool and/or opaque                            liquid, but mucosa seen  well), Transverse Colon = 3                            (entire mucosa seen well with no residual staining,                            small fragments of stool or opaque liquid) and Left                            Colon = 3 (entire mucosa seen well with no residual                            staining, small fragments of stool or opaque                            liquid). The total BBPS score equals 8. The quality                            of the bowel preparation was good. Scope In: 11:10:17 AM Scope Out: 11:27:08 AM Scope Withdrawal Time: 0 hours 12 minutes 4 seconds  Total Procedure Duration: 0 hours 16 minutes 51 seconds  Findings:      The perianal and digital rectal examinations were normal.      Non-bleeding internal hemorrhoids were found during endoscopy.      The entire examined colon appeared normal. Impression:               - Non-bleeding internal hemorrhoids.                           - The entire examined colon is normal.                           - No specimens collected. Moderate Sedation:      Per Anesthesia Care Recommendation:           - Patient has a contact number available for                            emergencies. The signs and symptoms of potential  delayed complications were discussed with the                            patient. Return to normal activities tomorrow.                            Written discharge instructions were provided to the                            patient.                           - Continue present medications.                           - Resume previous diet.                           - Repeat colonoscopy in 10 years for screening                            purposes.                           - Return to GI clinic PRN. Procedure Code(s):        --- Professional ---                           I3382, Colorectal cancer screening; colonoscopy on                            individual not meeting criteria for  high risk Diagnosis Code(s):        --- Professional ---                           Z12.11, Encounter for screening for malignant                            neoplasm of colon                           K64.8, Other hemorrhoids CPT copyright 2019 American Medical Association. All rights reserved. The codes documented in this report are preliminary and upon coder review may  be revised to meet current compliance requirements. Elon Alas. Abbey Chatters, DO [place] Abbey Chatters, DO 03/27/2020 11:28:44 AM This report has been signed electronically. Number of Addenda: 0

## 2020-03-27 NOTE — Transfer of Care (Signed)
Immediate Anesthesia Transfer of Care Note  Patient: Corey Cruz  Procedure(s) Performed: COLONOSCOPY WITH PROPOFOL (N/A )  Patient Location: Endoscopy Unit  Anesthesia Type:General  Level of Consciousness: awake, alert , oriented and patient cooperative  Airway & Oxygen Therapy: Patient Spontanous Breathing  Post-op Assessment: Report given to RN, Post -op Vital signs reviewed and stable and Patient moving all extremities  Post vital signs: Reviewed and stable  Last Vitals:  Vitals Value Taken Time  BP    Temp    Pulse    Resp    SpO2      Last Pain:  Vitals:   03/27/20 1107  TempSrc:   PainSc: 0-No pain      Patients Stated Pain Goal: 10 (16/10/96 0454)  Complications: No complications documented.

## 2020-03-27 NOTE — H&P (Signed)
Primary Care Physician:  Sharion Balloon, FNP Primary Gastroenterologist:  Dr. Abbey Chatters  Pre-Procedure History & Physical: HPI:  Corey Cruz is a 49 y.o. male is here for a colonoscopy for colon cancer screening purposes.  Patient denies any family history of colorectal cancer.  No melena or hematochezia.  No abdominal pain or unintentional weight loss.  No change in bowel habits.  Overall feels well from a GI standpoint.  Past Medical History:  Diagnosis Date  . Arthritis   . Diabetes mellitus without complication ([place])   . Hyperlipidemia   . Hypertension   . Lumbar disc disorder     Past Surgical History:  Procedure Laterality Date  . HERNIA REPAIR    . INGUINAL HERNIA REPAIR Bilateral 04/16/2017   Procedure: HERNIA REPAIR INGUINAL ADULT WITH MESH;  Surgeon: Virl Cagey, MD;  Location: [facility];  Service: General;  Laterality: Bilateral;    Prior to Admission medications   Medication Sig Start Date End Date Taking? Authorizing Provider  aspirin EC 81 MG tablet Take 1 tablet (81 mg total) by mouth daily. 12/11/15  Yes Hawks, Christy A, FNP  atorvastatin (LIPITOR) 20 MG tablet Take 1 tablet by mouth once daily Patient taking differently: Take 20 mg by mouth daily.  01/24/20  Yes Hawks, Christy A, FNP  JARDIANCE 10 MG TABS tablet Take 1 tablet by mouth once daily Patient taking differently: Take 10 mg by mouth daily.  01/24/20  Yes Hawks, Christy A, FNP  lisinopril (ZESTRIL) 10 MG tablet Take 1 tablet by mouth once daily Patient taking differently: Take 10 mg by mouth daily.  01/24/20  Yes Hawks, Christy A, FNP  meloxicam (MOBIC) 15 MG tablet Take 1 tablet by mouth once daily Patient taking differently: Take 15 mg by mouth daily.  01/24/20  Yes Hawks, Christy A, FNP  metFORMIN (GLUCOPHAGE) 1000 MG tablet TAKE 1 TABLET BY MOUTH TWICE DAILY WITH A MEAL Patient taking differently: Take 1,000 mg by mouth 2 (two) times daily with a meal.  01/24/20  Yes Hawks, Christy A, FNP   acetaminophen (TYLENOL) 500 MG tablet Take 1,000 mg by mouth every 6 (six) hours as needed for headache.     [provider]  sildenafil (VIAGRA) 100 MG tablet TAKE 1/2 TO 1 (ONE-HALF TO ONE) TABLET BY MOUTH ONCE DAILY AS NEEDED FOR ERECTILE DYSFUNCTION Patient taking differently: Take 50-100 mg by mouth as needed for erectile dysfunction.  08/24/19   Evelina Dun A, FNP  terbinafine (LAMISIL) 250 MG tablet Take 1 tablet (250 mg total) by mouth daily. Patient not taking: Reported on 03/24/2020 12/17/19   Sharion Balloon, FNP    Allergies as of 03/17/2020  . (No Known Allergies)    Family History  Problem Relation Age of Onset  . Cancer Mother        lung  . Cancer Brother        leukemia    Social History   Socioeconomic History  . Marital status: Single    Spouse name: Not on file  . Number of children: Not on file  . Years of education: Not on file  . Highest education level: Not on file  Occupational History  . Not on file  Tobacco Use  . Smoking status: Never Smoker  . Smokeless tobacco: Never Used  Vaping Use  . Vaping Use: Never used  Substance and Sexual Activity  . Alcohol use: Yes    Alcohol/week: 1.0 standard drink    Types: 1  Shots of liquor per week  . Drug use: No  . Sexual activity: Yes    Birth control/protection: None  Other Topics Concern  . Not on file  Social History Narrative  . Not on file   Social Determinants of Health   Financial Resource Strain:   . Difficulty of Paying Living Expenses: Not on file  Food Insecurity:   . Worried About Charity fundraiser in the Last Year: Not on file  . Ran Out of Food in the Last Year: Not on file  Transportation Needs:   . Lack of Transportation (Medical): Not on file  . Lack of Transportation (Non-Medical): Not on file  Physical Activity:   . Days of Exercise per Week: Not on file  . Minutes of Exercise per Session: Not on file  Stress:   . Feeling of Stress : Not on file  Social  Connections:   . Frequency of Communication with Friends and Family: Not on file  . Frequency of Social Gatherings with Friends and Family: Not on file  . Attends Religious Services: Not on file  . Active Member of Clubs or Organizations: Not on file  . Attends Archivist Meetings: Not on file  . Marital Status: Not on file  Intimate Partner Violence:   . Fear of Current or Ex-Partner: Not on file  . Emotionally Abused: Not on file  . Physically Abused: Not on file  . Sexually Abused: Not on file    Review of Systems: See HPI, otherwise negative ROS  Impression/Plan: Corey Cruz is here for a colonoscopy to be performed for colon cancer screening purposes.  The risks of the procedure including infection, bleed, or perforation as well as benefits, limitations, alternatives and imponderables have been reviewed with the patient. Questions have been answered. All parties agreeable.

## 2020-03-27 NOTE — Anesthesia Postprocedure Evaluation (Signed)
Anesthesia Post Note  Patient: Corey Cruz  Procedure(s) Performed: COLONOSCOPY WITH PROPOFOL (N/A )  Patient location during evaluation: Endoscopy Anesthesia Type: General Level of consciousness: awake, oriented, awake and alert and patient cooperative Pain management: pain level controlled Vital Signs Assessment: post-procedure vital signs reviewed and stable Respiratory status: spontaneous breathing, respiratory function stable and nonlabored ventilation Cardiovascular status: blood pressure returned to baseline and stable Postop Assessment: no headache and no backache Anesthetic complications: no   No complications documented.   Last Vitals:  Vitals:   03/27/20 1006  BP: 130/85  Pulse: 71  Resp: 16  Temp: 36.6 C  SpO2: 95%    Last Pain:  Vitals:   03/27/20 1107  TempSrc:   PainSc: 0-No pain                 Tacy Learn

## 2020-04-03 ENCOUNTER — Encounter (HOSPITAL_COMMUNITY): Payer: Self-pay | Admitting: Internal Medicine

## 2020-07-17 ENCOUNTER — Other Ambulatory Visit: Payer: Self-pay | Admitting: Family

## 2020-07-17 DIAGNOSIS — G8929 Other chronic pain: Secondary | ICD-10-CM

## 2020-07-17 DIAGNOSIS — I10 Essential (primary) hypertension: Secondary | ICD-10-CM

## 2020-07-17 DIAGNOSIS — N529 Male erectile dysfunction, unspecified: Secondary | ICD-10-CM

## 2020-07-17 DIAGNOSIS — E119 Type 2 diabetes mellitus without complications: Secondary | ICD-10-CM

## 2020-07-17 DIAGNOSIS — E785 Hyperlipidemia, unspecified: Secondary | ICD-10-CM

## 2020-07-18 ENCOUNTER — Other Ambulatory Visit: Payer: Self-pay | Admitting: Family

## 2020-07-18 DIAGNOSIS — E119 Type 2 diabetes mellitus without complications: Secondary | ICD-10-CM

## 2020-07-27 ENCOUNTER — Encounter: Payer: Self-pay | Admitting: *Deleted

## 2020-08-04 ENCOUNTER — Ambulatory Visit: Payer: Self-pay | Admitting: Family

## 2020-08-04 LAB — HM DIABETES EYE EXAM

## 2020-08-14 ENCOUNTER — Ambulatory Visit: Payer: Self-pay | Admitting: Family

## 2020-10-09 ENCOUNTER — Ambulatory Visit: Payer: Self-pay | Admitting: Family

## 2020-10-27 ENCOUNTER — Other Ambulatory Visit: Payer: Self-pay

## 2020-10-27 ENCOUNTER — Ambulatory Visit: Payer: Managed Care, Other (non HMO) | Admitting: Family

## 2020-10-27 ENCOUNTER — Encounter: Payer: Self-pay | Admitting: Family

## 2020-10-27 VITALS — BP 129/87 | HR 67 | Temp 97.6°F | Resp 20 | Ht 71.0 in | Wt 204.0 lb

## 2020-10-27 DIAGNOSIS — M545 Low back pain, unspecified: Secondary | ICD-10-CM

## 2020-10-27 DIAGNOSIS — E1169 Type 2 diabetes mellitus with other specified complication: Secondary | ICD-10-CM

## 2020-10-27 DIAGNOSIS — E119 Type 2 diabetes mellitus without complications: Secondary | ICD-10-CM

## 2020-10-27 DIAGNOSIS — Z0001 Encounter for general adult medical examination with abnormal findings: Secondary | ICD-10-CM

## 2020-10-27 DIAGNOSIS — E1159 Type 2 diabetes mellitus with other circulatory complications: Secondary | ICD-10-CM | POA: Diagnosis not present

## 2020-10-27 DIAGNOSIS — I152 Hypertension secondary to endocrine disorders: Secondary | ICD-10-CM

## 2020-10-27 DIAGNOSIS — Z0000 Encounter for general adult medical examination without abnormal findings: Secondary | ICD-10-CM

## 2020-10-27 DIAGNOSIS — E785 Hyperlipidemia, unspecified: Secondary | ICD-10-CM

## 2020-10-27 DIAGNOSIS — E559 Vitamin D deficiency, unspecified: Secondary | ICD-10-CM

## 2020-10-27 DIAGNOSIS — Z1159 Encounter for screening for other viral diseases: Secondary | ICD-10-CM

## 2020-10-27 DIAGNOSIS — E663 Overweight: Secondary | ICD-10-CM

## 2020-10-27 DIAGNOSIS — G8929 Other chronic pain: Secondary | ICD-10-CM

## 2020-10-27 LAB — CBC WITH DIFFERENTIAL/PLATELET
Basophils Absolute: 0 10*3/uL (ref 0.0–0.2)
Basos: 1 %
EOS (ABSOLUTE): 0 10*3/uL (ref 0.0–0.4)
Eos: 1 %
Hematocrit: 45.7 % (ref 37.5–51.0)
Hemoglobin: 15.9 g/dL (ref 13.0–17.7)
Immature Grans (Abs): 0 10*3/uL (ref 0.0–0.1)
Immature Granulocytes: 0 %
Lymphocytes Absolute: 2.3 10*3/uL (ref 0.7–3.1)
Lymphs: 36 %
MCH: 31.5 pg (ref 26.6–33.0)
MCHC: 34.8 g/dL (ref 31.5–35.7)
MCV: 91 fL (ref 79–97)
Monocytes Absolute: 0.7 10*3/uL (ref 0.1–0.9)
Monocytes: 11 %
Neutrophils Absolute: 3.4 10*3/uL (ref 1.4–7.0)
Neutrophils: 51 %
Platelets: 294 10*3/uL (ref 150–450)
RBC: 5.04 x10E6/uL (ref 4.14–5.80)
RDW: 11.9 % (ref 11.6–15.4)
WBC: 6.5 10*3/uL (ref 3.4–10.8)

## 2020-10-27 LAB — CMP14+EGFR
ALT: 22 IU/L (ref 0–44)
AST: 19 IU/L (ref 0–40)
Albumin/Globulin Ratio: 2.1 (ref 1.2–2.2)
Albumin: 4.7 g/dL (ref 3.8–4.9)
Alkaline Phosphatase: 65 IU/L (ref 44–121)
BUN/Creatinine Ratio: 16 (ref 9–20)
BUN: 13 mg/dL (ref 6–24)
Bilirubin Total: 0.6 mg/dL (ref 0.0–1.2)
CO2: 19 mmol/L — ABNORMAL LOW (ref 20–29)
Calcium: 9.4 mg/dL (ref 8.7–10.2)
Chloride: 101 mmol/L (ref 96–106)
Creatinine, Ser: 0.82 mg/dL (ref 0.76–1.27)
Globulin, Total: 2.2 g/dL (ref 1.5–4.5)
Glucose: 101 mg/dL — ABNORMAL HIGH (ref 65–99)
Potassium: 4.5 mmol/L (ref 3.5–5.2)
Sodium: 136 mmol/L (ref 134–144)
Total Protein: 6.9 g/dL (ref 6.0–8.5)
eGFR: 106 mL/min/{1.73_m2} (ref >59)

## 2020-10-27 LAB — LIPID PANEL
Chol/HDL Ratio: 3.6 ratio (ref 0.0–5.0)
Cholesterol, Total: 151 mg/dL (ref 100–199)
HDL: 42 mg/dL (ref >39)
LDL Chol Calc (NIH): 92 mg/dL (ref 0–99)
Triglycerides: 90 mg/dL (ref 0–149)
VLDL Cholesterol Cal: 17 mg/dL (ref 5–40)

## 2020-10-27 LAB — BAYER DCA HB A1C WAIVED: HB A1C (BAYER DCA - WAIVED): 6.3 % (ref <7.0)

## 2020-10-27 NOTE — Progress Notes (Signed)
Subjective:    Patient ID: Corey Cruz, male    DOB: April 26, 1971, 50 y.o.   MRN: [id number]  Chief Complaint  Patient presents with   Medical Management of Chronic Issues   Pt presents to the office today for CPE.  Diabetes He presents for his follow-up diabetic visit. He has type 2 diabetes mellitus. There are no hypoglycemic associated symptoms. Pertinent negatives for diabetes include no blurred vision and no foot paresthesias. Symptoms are stable. Pertinent negatives for diabetic complications include no heart disease. Risk factors for coronary artery disease include dyslipidemia, diabetes mellitus, hypertension and male sex. He is following a generally unhealthy diet. His overall blood glucose range is 110-130 mg/dl. An ACE inhibitor/angiotensin II receptor blocker is being taken.  Hypertension This is a chronic problem. The current episode started more than 1 year ago. The problem has been resolved since onset. The problem is controlled. Pertinent negatives include no blurred vision, malaise/fatigue, peripheral edema or shortness of breath. Risk factors for coronary artery disease include dyslipidemia, diabetes mellitus, sedentary lifestyle and male gender. The current treatment provides moderate improvement.  Hyperlipidemia This is a chronic problem. The current episode started more than 1 year ago. The problem is controlled. Recent lipid tests were reviewed and are normal. Pertinent negatives include no shortness of breath. Current antihyperlipidemic treatment includes statins. The current treatment provides moderate improvement of lipids. Risk factors for coronary artery disease include diabetes mellitus, dyslipidemia, hypertension, male sex and a sedentary lifestyle.  Back Pain This is a chronic problem. The current episode started more than 1 year ago. The problem occurs intermittently. The problem has been waxing and waning since onset. The pain is present in the lumbar spine.  The quality of the pain is described as aching. The pain is at a severity of 3/10. The pain is moderate. The symptoms are aggravated by bending.     Review of Systems  Constitutional:  Negative for malaise/fatigue.  Eyes:  Negative for blurred vision.  Respiratory:  Negative for shortness of breath.   Musculoskeletal:  Positive for back pain.  All other systems reviewed and are negative.  Family History  Problem Relation Age of Onset   Cancer Mother        lung   Cancer Brother        leukemia   Social History   Socioeconomic History   Marital status: Single    Spouse name: Not on file   Number of children: Not on file   Years of education: Not on file   Highest education level: Not on file  Occupational History   Not on file  Tobacco Use   Smoking status: Never   Smokeless tobacco: Never  Vaping Use   Vaping Use: Never used  Substance and Sexual Activity   Alcohol use: Yes    Alcohol/week: 1.0 standard drink    Types: 1 Shots of liquor per week   Drug use: No   Sexual activity: Yes    Birth control/protection: None  Other Topics Concern   Not on file  Social History Narrative   Not on file   Social Determinants of Health   Financial Resource Strain: Not on file  Food Insecurity: Not on file  Transportation Needs: Not on file  Physical Activity: Not on file  Stress: Not on file  Social Connections: Not on file       Objective:   Physical Exam Vitals reviewed.  Constitutional:      General: He is  not in acute distress.    Appearance: He is well-developed.  HENT:     Head: Normocephalic.     Right Ear: Tympanic membrane normal.     Left Ear: Tympanic membrane normal.  Eyes:     General:        Right eye: No discharge.        Left eye: No discharge.     Pupils: Pupils are equal, round, and reactive to light.  Neck:     Thyroid: No thyromegaly.  Cardiovascular:     Rate and Rhythm: Normal rate and regular rhythm.     Heart sounds: Normal heart  sounds. No murmur heard. Pulmonary:     Effort: Pulmonary effort is normal. No respiratory distress.     Breath sounds: Normal breath sounds. No wheezing.  Abdominal:     General: Bowel sounds are normal. There is no distension.     Palpations: Abdomen is soft.     Tenderness: There is no abdominal tenderness.  Musculoskeletal:        General: No tenderness. Normal range of motion.     Cervical back: Normal range of motion and neck supple.  Skin:    General: Skin is warm and dry.     Findings: No erythema or rash.  Neurological:     Mental Status: He is alert and oriented to person, place, and time.     Cranial Nerves: No cranial nerve deficit.     Deep Tendon Reflexes: Reflexes are normal and symmetric.  Psychiatric:        Behavior: Behavior normal.        Thought Content: Thought content normal.        Judgment: Judgment normal.      BP 129/87   Pulse 67   Temp 97.6 F (36.4 C) (Temporal)   Resp 20   Ht '5\' 11"'  (1.803 m)   Wt 204 lb (92.5 kg)   SpO2 94%   BMI 28.45 kg/m      Assessment & Plan:  Corey Cruz comes in today with chief complaint of Medical Management of Chronic Issues   Diagnosis and orders addressed:  1. Hypertension associated with diabetes ([place]) - CBC with Differential/Platelet - CMP14+EGFR  2. Diabetes mellitus without complication ([place]) - Bayer DCA Hb A1c Waived - Microalbumin / creatinine urine ratio  3. Hyperlipidemia associated with type 2 diabetes mellitus ([place]) - Lipid panel  4. Annual physical exam - TSH - PSA, total and free - Hepatitis C antibody  5. Chronic bilateral low back pain without sciatica  6. Overweight (BMI 25.0-29.9)  7. Vitamin D deficiency  8. Need for hepatitis C screening test - Hepatitis C antibody   Labs pending Health Maintenance reviewed Diet and exercise encouraged  Follow up plan: 6 months    Evelina Dun, FNP

## 2020-10-27 NOTE — Patient Instructions (Signed)
Health Maintenance, Male Adopting a healthy lifestyle and getting preventive care are important in promoting health and wellness. Ask your health care provider about: The right schedule for you to have regular tests and exams. Things you can do on your own to prevent diseases and keep yourself healthy. What should I know about diet, weight, and exercise? Eat a healthy diet  Eat a diet that includes plenty of vegetables, fruits, low-fat dairy products, and lean protein. Do not eat a lot of foods that are high in solid fats, added sugars, or sodium.  Maintain a healthy weight Body mass index (BMI) is a measurement that can be used to identify possible weight problems. It estimates body fat based on height and weight. Your health care provider can help determine your BMI and help you achieve or maintain ahealthy weight. Get regular exercise Get regular exercise. This is one of the most important things you can do for your health. Most adults should: Exercise for at least 150 minutes each week. The exercise should increase your heart rate and make you sweat (moderate-intensity exercise). Do strengthening exercises at least twice a week. This is in addition to the moderate-intensity exercise. Spend less time sitting. Even light physical activity can be beneficial. Watch cholesterol and blood lipids Have your blood tested for lipids and cholesterol at 52 years of age, then havethis test every 5 years. You may need to have your cholesterol levels checked more often if: Your lipid or cholesterol levels are high. You are older than 52 years of age. You are at high risk for heart disease. What should I know about cancer screening? Many types of cancers can be detected early and may often be prevented. Depending on your health history and family history, you may need to have cancer screening at various ages. This may include screening for: Colorectal cancer. Prostate cancer. Skin cancer. Lung  cancer. What should I know about heart disease, diabetes, and high blood pressure? Blood pressure and heart disease High blood pressure causes heart disease and increases the risk of stroke. This is more likely to develop in people who have high blood pressure readings, are of African descent, or are overweight. Talk with your health care provider about your target blood pressure readings. Have your blood pressure checked: Every 3-5 years if you are 18-39 years of age. Every year if you are 40 years old or older. If you are between the ages of 65 and 75 and are a current or former smoker, ask your health care provider if you should have a one-time screening for abdominal aortic aneurysm (AAA). Diabetes Have regular diabetes screenings. This checks your fasting blood sugar level. Have the screening done: Once every three years after age 45 if you are at a normal weight and have a low risk for diabetes. More often and at a younger age if you are overweight or have a high risk for diabetes. What should I know about preventing infection? Hepatitis B If you have a higher risk for hepatitis B, you should be screened for this virus. Talk with your health care provider to find out if you are at risk forhepatitis B infection. Hepatitis C Blood testing is recommended for: Everyone born from 1945 through 1965. Anyone with known risk factors for hepatitis C. Sexually transmitted infections (STIs) You should be screened each year for STIs, including gonorrhea and chlamydia, if: You are sexually active and are younger than 52 years of age. You are older than 52 years of age   and your health care provider tells you that you are at risk for this type of infection. Your sexual activity has changed since you were last screened, and you are at increased risk for chlamydia or gonorrhea. Ask your health care provider if you are at risk. Ask your health care provider about whether you are at high risk for HIV.  Your health care provider may recommend a prescription medicine to help prevent HIV infection. If you choose to take medicine to prevent HIV, you should first get tested for HIV. You should then be tested every 3 months for as long as you are taking the medicine. Follow these instructions at home: Lifestyle Do not use any products that contain nicotine or tobacco, such as cigarettes, e-cigarettes, and chewing tobacco. If you need help quitting, ask your health care provider. Do not use street drugs. Do not share needles. Ask your health care provider for help if you need support or information about quitting drugs. Alcohol use Do not drink alcohol if your health care provider tells you not to drink. If you drink alcohol: Limit how much you have to 0-2 drinks a day. Be aware of how much alcohol is in your drink. In the [place] one drink equals one 12 oz bottle of beer (355 mL), one 5 oz glass of wine (148 mL), or one 1 oz glass of hard liquor (44 mL). General instructions Schedule regular health, dental, and eye exams. Stay current with your vaccines. Tell your health care provider if: You often feel depressed. You have ever been abused or do not feel safe at home. Summary Adopting a healthy lifestyle and getting preventive care are important in promoting health and wellness. Follow your health care provider's instructions about healthy diet, exercising, and getting tested or screened for diseases. Follow your health care provider's instructions on monitoring your cholesterol and blood pressure. This information is not intended to replace advice given to you by your health care provider. Make sure you discuss any questions you have with your healthcare provider. Document Revised: 04/15/2018 Document Reviewed: 04/15/2018 Elsevier Patient Education  2022 [employer] Inc.  

## 2020-10-28 LAB — MICROALBUMIN / CREATININE URINE RATIO
Creatinine, Urine: 75.8 mg/dL
Microalb/Creat Ratio: <4 mg/g creat (ref 0–29)
Microalbumin, Urine: <3 ug/mL

## 2020-10-31 ENCOUNTER — Other Ambulatory Visit: Payer: Self-pay | Admitting: Family

## 2020-10-31 DIAGNOSIS — E119 Type 2 diabetes mellitus without complications: Secondary | ICD-10-CM

## 2020-10-31 DIAGNOSIS — E785 Hyperlipidemia, unspecified: Secondary | ICD-10-CM

## 2020-10-31 DIAGNOSIS — I10 Essential (primary) hypertension: Secondary | ICD-10-CM

## 2020-10-31 LAB — PSA, TOTAL AND FREE
PSA, Free Pct: 20 %
PSA, Free: 0.04 ng/mL
Prostate Specific Ag, Serum: 0.2 ng/mL (ref 0.0–4.0)

## 2020-10-31 LAB — SPECIMEN STATUS REPORT

## 2020-10-31 LAB — TSH: TSH: 1.35 u[IU]/mL (ref 0.450–4.500)

## 2020-11-01 ENCOUNTER — Other Ambulatory Visit: Payer: Self-pay | Admitting: *Deleted

## 2020-11-01 DIAGNOSIS — N529 Male erectile dysfunction, unspecified: Secondary | ICD-10-CM

## 2020-11-01 MED ORDER — SILDENAFIL CITRATE 100 MG PO TABS: 50.0000 mg | ORAL_TABLET | ORAL | 2 refills | Status: DC | PRN

## 2021-01-29 ENCOUNTER — Other Ambulatory Visit: Payer: Self-pay | Admitting: Family

## 2021-01-29 DIAGNOSIS — E119 Type 2 diabetes mellitus without complications: Secondary | ICD-10-CM

## 2021-01-29 DIAGNOSIS — G8929 Other chronic pain: Secondary | ICD-10-CM

## 2021-04-25 ENCOUNTER — Other Ambulatory Visit: Payer: Self-pay | Admitting: Family

## 2021-04-25 DIAGNOSIS — E785 Hyperlipidemia, unspecified: Secondary | ICD-10-CM

## 2021-04-25 DIAGNOSIS — G8929 Other chronic pain: Secondary | ICD-10-CM

## 2021-04-25 DIAGNOSIS — I10 Essential (primary) hypertension: Secondary | ICD-10-CM

## 2021-04-25 DIAGNOSIS — E119 Type 2 diabetes mellitus without complications: Secondary | ICD-10-CM

## 2021-06-11 ENCOUNTER — Other Ambulatory Visit: Payer: Self-pay | Admitting: Family

## 2021-06-11 DIAGNOSIS — G8929 Other chronic pain: Secondary | ICD-10-CM

## 2021-06-11 DIAGNOSIS — E785 Hyperlipidemia, unspecified: Secondary | ICD-10-CM

## 2021-06-11 DIAGNOSIS — N529 Male erectile dysfunction, unspecified: Secondary | ICD-10-CM

## 2021-06-11 DIAGNOSIS — I10 Essential (primary) hypertension: Secondary | ICD-10-CM

## 2021-06-11 DIAGNOSIS — M545 Low back pain, unspecified: Secondary | ICD-10-CM

## 2021-06-11 DIAGNOSIS — E119 Type 2 diabetes mellitus without complications: Secondary | ICD-10-CM

## 2021-06-11 MED ORDER — SILDENAFIL CITRATE 100 MG PO TABS: 50.0000 mg | ORAL_TABLET | ORAL | 1 refills | Status: DC | PRN

## 2021-06-11 MED ORDER — METFORMIN HCL 1000 MG PO TABS: 1000.0000 mg | ORAL_TABLET | Freq: Two times a day (BID) | ORAL | 0 refills | Status: DC

## 2021-06-11 MED ORDER — MELOXICAM 15 MG PO TABS: 15.0000 mg | ORAL_TABLET | Freq: Every day | ORAL | 0 refills | Status: DC

## 2021-06-11 MED ORDER — LISINOPRIL 10 MG PO TABS: 10.0000 mg | ORAL_TABLET | Freq: Every day | ORAL | 0 refills | Status: DC

## 2021-06-11 MED ORDER — EMPAGLIFLOZIN 10 MG PO TABS: 10.0000 mg | ORAL_TABLET | Freq: Every day | ORAL | 0 refills | Status: DC

## 2021-06-11 MED ORDER — ATORVASTATIN CALCIUM 20 MG PO TABS: 20.0000 mg | ORAL_TABLET | Freq: Every day | ORAL | 0 refills | Status: DC

## 2021-06-11 NOTE — Telephone Encounter (Signed)
°  Prescription Request  06/11/2021  Is this a "Controlled Substance" medicine? NO  Have you seen your PCP in the last 2 weeks? NO, appt scheduled for March 3rd. Patient will be out of town until then. He said that he can do a televisit if needed.   If YES, route message to pool  -  If NO, patient needs to be scheduled for appointment.  What is the name of the medication or equipment? Metformin, jardiance, meloxicam, sildenafil and atorvastatin   Have you contacted your pharmacy to request a refill? yes   Which pharmacy would you like this sent to? [facility] in [place]    Patient notified that their request is being sent to the clinical staff for review and that they should receive a response within 2 business days.

## 2021-06-13 LAB — HM DIABETES EYE EXAM

## 2021-07-06 ENCOUNTER — Encounter: Payer: Self-pay | Admitting: Family

## 2021-07-06 ENCOUNTER — Ambulatory Visit: Payer: Managed Care, Other (non HMO) | Admitting: Family

## 2021-07-06 VITALS — BP 120/81 | HR 60 | Temp 96.9°F | Ht 71.0 in | Wt 206.0 lb

## 2021-07-06 DIAGNOSIS — E1169 Type 2 diabetes mellitus with other specified complication: Secondary | ICD-10-CM

## 2021-07-06 DIAGNOSIS — E663 Overweight: Secondary | ICD-10-CM

## 2021-07-06 DIAGNOSIS — I10 Essential (primary) hypertension: Secondary | ICD-10-CM

## 2021-07-06 DIAGNOSIS — E785 Hyperlipidemia, unspecified: Secondary | ICD-10-CM

## 2021-07-06 DIAGNOSIS — I152 Hypertension secondary to endocrine disorders: Secondary | ICD-10-CM

## 2021-07-06 DIAGNOSIS — M545 Low back pain, unspecified: Secondary | ICD-10-CM | POA: Diagnosis not present

## 2021-07-06 DIAGNOSIS — E1159 Type 2 diabetes mellitus with other circulatory complications: Secondary | ICD-10-CM

## 2021-07-06 DIAGNOSIS — E559 Vitamin D deficiency, unspecified: Secondary | ICD-10-CM

## 2021-07-06 DIAGNOSIS — N529 Male erectile dysfunction, unspecified: Secondary | ICD-10-CM | POA: Diagnosis not present

## 2021-07-06 DIAGNOSIS — G8929 Other chronic pain: Secondary | ICD-10-CM

## 2021-07-06 DIAGNOSIS — Z1159 Encounter for screening for other viral diseases: Secondary | ICD-10-CM

## 2021-07-06 LAB — BAYER DCA HB A1C WAIVED: HB A1C (BAYER DCA - WAIVED): 6.1 % — ABNORMAL HIGH (ref 4.8–5.6)

## 2021-07-06 MED ORDER — ACETAMINOPHEN 500 MG PO TABS: 1000.0000 mg | ORAL_TABLET | Freq: Four times a day (QID) | ORAL | 3 refills | Status: DC | PRN

## 2021-07-06 MED ORDER — SILDENAFIL CITRATE 100 MG PO TABS: 50.0000 mg | ORAL_TABLET | ORAL | 1 refills | Status: DC | PRN

## 2021-07-06 MED ORDER — MELOXICAM 15 MG PO TABS: 15.0000 mg | ORAL_TABLET | Freq: Every day | ORAL | 3 refills | Status: DC

## 2021-07-06 MED ORDER — EMPAGLIFLOZIN 10 MG PO TABS: 10.0000 mg | ORAL_TABLET | Freq: Every day | ORAL | 3 refills | Status: DC

## 2021-07-06 MED ORDER — METFORMIN HCL 1000 MG PO TABS: 1000.0000 mg | ORAL_TABLET | Freq: Two times a day (BID) | ORAL | 3 refills | Status: DC

## 2021-07-06 MED ORDER — LISINOPRIL 10 MG PO TABS: 10.0000 mg | ORAL_TABLET | Freq: Every day | ORAL | 0 refills | Status: DC

## 2021-07-06 MED ORDER — ASPIRIN EC 81 MG PO TBEC
81.0000 mg | DELAYED_RELEASE_TABLET | Freq: Every day | ORAL | 1 refills | Status: AC
Start: 2021-07-06 — End: ?

## 2021-07-06 MED ORDER — ATORVASTATIN CALCIUM 20 MG PO TABS: 20.0000 mg | ORAL_TABLET | Freq: Every day | ORAL | 3 refills | Status: DC

## 2021-07-06 NOTE — Patient Instructions (Signed)

## 2021-07-06 NOTE — Progress Notes (Signed)
? ?Subjective:  ? ? Patient ID: Corey Cruz, male    DOB: 09-Jul-1970, 51 y.o.   MRN: [id number] ? ?Chief Complaint  ?Patient presents with  ? Medical Management of Chronic Issues  ? ?Pt presents to the office today chronic follow up.  ED and takes Viagra as needed that works well.  ?Diabetes ?He presents for his follow-up diabetic visit. He has type 2 diabetes mellitus. Pertinent negatives for diabetes include no blurred vision and no foot paresthesias. Symptoms are stable. Pertinent negatives for diabetic complications include no CVA. Risk factors for coronary artery disease include diabetes mellitus, dyslipidemia, male sex, hypertension and sedentary lifestyle. His weight is stable. He is following a generally healthy diet. His overall blood glucose range is 110-130 mg/dl. An ACE inhibitor/angiotensin II receptor blocker is being taken.  ?Hypertension ?This is a chronic problem. The current episode started more than 1 year ago. The problem has been resolved since onset. The problem is controlled. Pertinent negatives include no blurred vision, malaise/fatigue, peripheral edema or shortness of breath. Risk factors for coronary artery disease include dyslipidemia, diabetes mellitus, obesity, male gender and sedentary lifestyle. The current treatment provides moderate improvement. There is no history of CVA or heart failure.  ?Hyperlipidemia ?This is a chronic problem. The current episode started more than 1 year ago. Exacerbating diseases include obesity. Pertinent negatives include no shortness of breath. Current antihyperlipidemic treatment includes statins. The current treatment provides moderate improvement of lipids. Risk factors for coronary artery disease include dyslipidemia, diabetes mellitus, male sex, hypertension and a sedentary lifestyle.  ?Back Pain ?This is a chronic problem. The current episode started more than 1 year ago. The problem occurs intermittently. The problem has been waxing and  waning since onset. The pain is present in the lumbar spine. The quality of the pain is described as aching. The pain is at a severity of 1/10. The pain is mild. The symptoms are aggravated by bending. The treatment provided mild relief.  ? ? ? ?Review of Systems  ?Constitutional:  Negative for malaise/fatigue.  ?Eyes:  Negative for blurred vision.  ?Respiratory:  Negative for shortness of breath.   ?Musculoskeletal:  Positive for back pain.  ?All other systems reviewed and are negative. ? ?   ?Objective:  ? Physical Exam ?Vitals reviewed.  ?Constitutional:   ?   General: He is not in acute distress. ?   Appearance: He is well-developed.  ?HENT:  ?   Head: Normocephalic.  ?   Right Ear: Tympanic membrane normal.  ?   Left Ear: Tympanic membrane normal.  ?Eyes:  ?   General:     ?   Right eye: No discharge.     ?   Left eye: No discharge.  ?   Pupils: Pupils are equal, round, and reactive to light.  ?Neck:  ?   Thyroid: No thyromegaly.  ?Cardiovascular:  ?   Rate and Rhythm: Normal rate and regular rhythm.  ?   Heart sounds: Normal heart sounds. No murmur heard. ?Pulmonary:  ?   Effort: Pulmonary effort is normal. No respiratory distress.  ?   Breath sounds: Normal breath sounds. No wheezing.  ?Abdominal:  ?   General: Bowel sounds are normal. There is no distension.  ?   Palpations: Abdomen is soft.  ?   Tenderness: There is no abdominal tenderness.  ?Musculoskeletal:     ?   General: No tenderness. Normal range of motion.  ?   Cervical back: Normal range of motion  and neck supple.  ?Skin: ?   General: Skin is warm and dry.  ?   Findings: No erythema or rash.  ?Neurological:  ?   Mental Status: He is alert and oriented to person, place, and time.  ?   Cranial Nerves: No cranial nerve deficit.  ?   Deep Tendon Reflexes: Reflexes are normal and symmetric.  ?Psychiatric:     ?   Behavior: Behavior normal.     ?   Thought Content: Thought content normal.     ?   Judgment: Judgment normal.  ? ? ? ? ?BP 120/81   Pulse 60    Temp (!) 96.9 ?F (36.1 ?C) (Temporal)   Ht '5\' 11"'  (1.803 m)   Wt 206 lb (93.4 kg)   BMI 28.73 kg/m?  ? ?   ?Assessment & Plan:  ?RODARIUS KICHLINE comes in today with chief complaint of Medical Management of Chronic Issues ? ? ?Diagnosis and orders addressed: ? ?1. Chronic bilateral low back pain without sciatica ? ?- meloxicam (MOBIC) 15 MG tablet; Take 1 tablet (15 mg total) by mouth daily.  Dispense: 90 tablet; Refill: 3 ?- CMP14+EGFR ?- CBC with Differential/Platelet ? ?2. Type 2 diabetes mellitus with other specified complication, without long-term current use of insulin (HCC) ?- metFORMIN (GLUCOPHAGE) 1000 MG tablet; Take 1 tablet (1,000 mg total) by mouth 2 (two) times daily with a meal.  Dispense: 180 tablet; Refill: 3 ?- empagliflozin (JARDIANCE) 10 MG TABS tablet; Take 1 tablet (10 mg total) by mouth daily.  Dispense: 90 tablet; Refill: 3 ?- Bayer DCA Hb A1c Waived ?- CMP14+EGFR ?- CBC with Differential/Platelet ? ?3. Erectile dysfunction, unspecified erectile dysfunction type ?- sildenafil (VIAGRA) 100 MG tablet; Take 0.5-1 tablets (50-100 mg total) by mouth as needed for erectile dysfunction.  Dispense: 30 tablet; Refill: 1 ?- CMP14+EGFR ?- CBC with Differential/Platelet ? ?4. Essential hypertension ?- lisinopril (ZESTRIL) 10 MG tablet; Take 1 tablet (10 mg total) by mouth daily.  Dispense: 90 tablet; Refill: 0 ?- lisinopril (ZESTRIL) 10 MG tablet; Take 1 tablet (10 mg total) by mouth daily.  Dispense: 90 tablet; Refill: 0 ?- CMP14+EGFR ?- CBC with Differential/Platelet ? ?5. Hyperlipidemia, unspecified hyperlipidemia type ?- atorvastatin (LIPITOR) 20 MG tablet; Take 1 tablet (20 mg total) by mouth daily.  Dispense: 90 tablet; Refill: 3 ?- CMP14+EGFR ?- CBC with Differential/Platelet ? ?6. Need for hepatitis C screening test ?- CBC with Differential/Platelet ?- Hepatitis C antibody ? ?7. Vitamin D deficiency ? ?8. Overweight (BMI 25.0-29.9) ? ?9. Hyperlipidemia associated with type 2 diabetes  mellitus ([place]) ? ?10. Hypertension associated with diabetes ([place]) ? ? ? ?Labs pending ?Health Maintenance reviewed ?Diet and exercise encouraged ? ?Follow up plan: ?6 months  ? ? ?Evelina Dun, FNP ? ? ?

## 2021-07-07 LAB — CBC WITH DIFFERENTIAL/PLATELET
Basophils Absolute: 0 10*3/uL (ref 0.0–0.2)
Basos: 1 %
EOS (ABSOLUTE): 0.1 10*3/uL (ref 0.0–0.4)
Eos: 1 %
Hematocrit: 46.3 % (ref 37.5–51.0)
Hemoglobin: 15.9 g/dL (ref 13.0–17.7)
Immature Grans (Abs): 0 10*3/uL (ref 0.0–0.1)
Immature Granulocytes: 0 %
Lymphocytes Absolute: 3.7 10*3/uL — ABNORMAL HIGH (ref 0.7–3.1)
Lymphs: 43 %
MCH: 31.7 pg (ref 26.6–33.0)
MCHC: 34.3 g/dL (ref 31.5–35.7)
MCV: 92 fL (ref 79–97)
Monocytes Absolute: 0.8 10*3/uL (ref 0.1–0.9)
Monocytes: 10 %
Neutrophils Absolute: 3.9 10*3/uL (ref 1.4–7.0)
Neutrophils: 45 %
Platelets: 258 10*3/uL (ref 150–450)
RBC: 5.01 x10E6/uL (ref 4.14–5.80)
RDW: 12.2 % (ref 11.6–15.4)
WBC: 8.6 10*3/uL (ref 3.4–10.8)

## 2021-07-07 LAB — CMP14+EGFR
ALT: 22 IU/L (ref 0–44)
AST: 19 IU/L (ref 0–40)
Albumin/Globulin Ratio: 2.2 (ref 1.2–2.2)
Albumin: 4.7 g/dL (ref 3.8–4.9)
Alkaline Phosphatase: 67 IU/L (ref 44–121)
BUN/Creatinine Ratio: 20 (ref 9–20)
BUN: 14 mg/dL (ref 6–24)
Bilirubin Total: 0.5 mg/dL (ref 0.0–1.2)
CO2: 23 mmol/L (ref 20–29)
Calcium: 9.4 mg/dL (ref 8.7–10.2)
Chloride: 101 mmol/L (ref 96–106)
Creatinine, Ser: 0.69 mg/dL — ABNORMAL LOW (ref 0.76–1.27)
Globulin, Total: 2.1 g/dL (ref 1.5–4.5)
Glucose: 85 mg/dL (ref 70–99)
Potassium: 4.2 mmol/L (ref 3.5–5.2)
Sodium: 138 mmol/L (ref 134–144)
Total Protein: 6.8 g/dL (ref 6.0–8.5)
eGFR: 111 mL/min/{1.73_m2} (ref >59)

## 2021-07-07 LAB — HEPATITIS C ANTIBODY: Hep C Virus Ab: NONREACTIVE

## 2021-07-23 ENCOUNTER — Telehealth: Payer: Self-pay | Admitting: Family

## 2021-07-23 NOTE — Telephone Encounter (Signed)
Patient aware that Corey Cruz was sent to pharmacy on same date as Corey Cruz. ?

## 2022-01-08 ENCOUNTER — Encounter: Payer: Managed Care, Other (non HMO) | Admitting: Family

## 2022-01-25 ENCOUNTER — Encounter: Payer: Self-pay | Admitting: Family

## 2022-01-25 ENCOUNTER — Ambulatory Visit (INDEPENDENT_AMBULATORY_CARE_PROVIDER_SITE_OTHER): Payer: Managed Care, Other (non HMO) | Admitting: Family

## 2022-01-25 VITALS — BP 129/74 | HR 79 | Temp 97.7°F | Ht 71.0 in | Wt 204.8 lb

## 2022-01-25 DIAGNOSIS — N529 Male erectile dysfunction, unspecified: Secondary | ICD-10-CM

## 2022-01-25 DIAGNOSIS — E1159 Type 2 diabetes mellitus with other circulatory complications: Secondary | ICD-10-CM

## 2022-01-25 DIAGNOSIS — E785 Hyperlipidemia, unspecified: Secondary | ICD-10-CM

## 2022-01-25 DIAGNOSIS — G8929 Other chronic pain: Secondary | ICD-10-CM

## 2022-01-25 DIAGNOSIS — Z0001 Encounter for general adult medical examination with abnormal findings: Secondary | ICD-10-CM | POA: Diagnosis not present

## 2022-01-25 DIAGNOSIS — I10 Essential (primary) hypertension: Secondary | ICD-10-CM | POA: Diagnosis not present

## 2022-01-25 DIAGNOSIS — E1169 Type 2 diabetes mellitus with other specified complication: Secondary | ICD-10-CM

## 2022-01-25 DIAGNOSIS — I152 Hypertension secondary to endocrine disorders: Secondary | ICD-10-CM

## 2022-01-25 DIAGNOSIS — M545 Low back pain, unspecified: Secondary | ICD-10-CM | POA: Diagnosis not present

## 2022-01-25 DIAGNOSIS — E559 Vitamin D deficiency, unspecified: Secondary | ICD-10-CM

## 2022-01-25 DIAGNOSIS — Z0000 Encounter for general adult medical examination without abnormal findings: Secondary | ICD-10-CM

## 2022-01-25 DIAGNOSIS — E663 Overweight: Secondary | ICD-10-CM

## 2022-01-25 LAB — BAYER DCA HB A1C WAIVED: HB A1C (BAYER DCA - WAIVED): 6.2 % — ABNORMAL HIGH (ref 4.8–5.6)

## 2022-01-25 MED ORDER — METFORMIN HCL 1000 MG PO TABS: 1000.0000 mg | ORAL_TABLET | Freq: Two times a day (BID) | ORAL | 3 refills | Status: DC

## 2022-01-25 MED ORDER — EMPAGLIFLOZIN 10 MG PO TABS: 10.0000 mg | ORAL_TABLET | Freq: Every day | ORAL | 3 refills | Status: DC

## 2022-01-25 MED ORDER — ATORVASTATIN CALCIUM 20 MG PO TABS: 20.0000 mg | ORAL_TABLET | Freq: Every day | ORAL | 3 refills | Status: DC

## 2022-01-25 MED ORDER — SILDENAFIL CITRATE 100 MG PO TABS: 50.0000 mg | ORAL_TABLET | ORAL | 1 refills | Status: DC | PRN

## 2022-01-25 MED ORDER — LISINOPRIL 10 MG PO TABS: 10.0000 mg | ORAL_TABLET | Freq: Every day | ORAL | 0 refills | Status: DC

## 2022-01-25 MED ORDER — MELOXICAM 15 MG PO TABS: 15.0000 mg | ORAL_TABLET | Freq: Every day | ORAL | 3 refills | Status: DC

## 2022-01-25 NOTE — Patient Instructions (Signed)
Health Maintenance, Male Adopting a healthy lifestyle and getting preventive care are important in promoting health and wellness. Ask your health care provider about: The right schedule for you to have regular tests and exams. Things you can do on your own to prevent diseases and keep yourself healthy. What should I know about diet, weight, and exercise? Eat a healthy diet  Eat a diet that includes plenty of vegetables, fruits, low-fat dairy products, and lean protein. Do not eat a lot of foods that are high in solid fats, added sugars, or sodium. Maintain a healthy weight Body mass index (BMI) is a measurement that can be used to identify possible weight problems. It estimates body fat based on height and weight. Your health care provider can help determine your BMI and help you achieve or maintain a healthy weight. Get regular exercise Get regular exercise. This is one of the most important things you can do for your health. Most adults should: Exercise for at least 150 minutes each week. The exercise should increase your heart rate and make you sweat (moderate-intensity exercise). Do strengthening exercises at least twice a week. This is in addition to the moderate-intensity exercise. Spend less time sitting. Even light physical activity can be beneficial. Watch cholesterol and blood lipids Have your blood tested for lipids and cholesterol at 51 years of age, then have this test every 5 years. You may need to have your cholesterol levels checked more often if: Your lipid or cholesterol levels are high. You are older than 53 years of age. You are at high risk for heart disease. What should I know about cancer screening? Many types of cancers can be detected early and may often be prevented. Depending on your health history and family history, you may need to have cancer screening at various ages. This may include screening for: Colorectal cancer. Prostate cancer. Skin cancer. Lung  cancer. What should I know about heart disease, diabetes, and high blood pressure? Blood pressure and heart disease High blood pressure causes heart disease and increases the risk of stroke. This is more likely to develop in people who have high blood pressure readings or are overweight. Talk with your health care provider about your target blood pressure readings. Have your blood pressure checked: Every 3-5 years if you are 18-39 years of age. Every year if you are 40 years old or older. If you are between the ages of 65 and 75 and are a current or former smoker, ask your health care provider if you should have a one-time screening for abdominal aortic aneurysm (AAA). Diabetes Have regular diabetes screenings. This checks your fasting blood sugar level. Have the screening done: Once every three years after age 45 if you are at a normal weight and have a low risk for diabetes. More often and at a younger age if you are overweight or have a high risk for diabetes. What should I know about preventing infection? Hepatitis B If you have a higher risk for hepatitis B, you should be screened for this virus. Talk with your health care provider to find out if you are at risk for hepatitis B infection. Hepatitis C Blood testing is recommended for: Everyone born from 1945 through 1965. Anyone with known risk factors for hepatitis C. Sexually transmitted infections (STIs) You should be screened each year for STIs, including gonorrhea and chlamydia, if: You are sexually active and are younger than 53 years of age. You are older than 53 years of age and your   health care provider tells you that you are at risk for this type of infection. Your sexual activity has changed since you were last screened, and you are at increased risk for chlamydia or gonorrhea. Ask your health care provider if you are at risk. Ask your health care provider about whether you are at high risk for HIV. Your health care provider  may recommend a prescription medicine to help prevent HIV infection. If you choose to take medicine to prevent HIV, you should first get tested for HIV. You should then be tested every 3 months for as long as you are taking the medicine. Follow these instructions at home: Alcohol use Do not drink alcohol if your health care provider tells you not to drink. If you drink alcohol: Limit how much you have to 0-2 drinks a day. Know how much alcohol is in your drink. In the [place] one drink equals one 12 oz bottle of beer (355 mL), one 5 oz glass of wine (148 mL), or one 1 oz glass of hard liquor (44 mL). Lifestyle Do not use any products that contain nicotine or tobacco. These products include cigarettes, chewing tobacco, and vaping devices, such as e-cigarettes. If you need help quitting, ask your health care provider. Do not use street drugs. Do not share needles. Ask your health care provider for help if you need support or information about quitting drugs. General instructions Schedule regular health, dental, and eye exams. Stay current with your vaccines. Tell your health care provider if: You often feel depressed. You have ever been abused or do not feel safe at home. Summary Adopting a healthy lifestyle and getting preventive care are important in promoting health and wellness. Follow your health care provider's instructions about healthy diet, exercising, and getting tested or screened for diseases. Follow your health care provider's instructions on monitoring your cholesterol and blood pressure. This information is not intended to replace advice given to you by your health care provider. Make sure you discuss any questions you have with your health care provider. Document Revised: 09/11/2020 Document Reviewed: 09/11/2020 Elsevier Patient Education  2023 [employer].  

## 2022-01-25 NOTE — Progress Notes (Signed)
Subjective:    Patient ID: Corey Cruz, male    DOB: 01-20-1971, 51 y.o.   MRN: [id number]  Chief Complaint  Patient presents with   Annual Exam   Pt presents to the office today CPE. He has   ED and takes Viagra as needed that works well.  Hypertension This is a chronic problem. The current episode started more than 1 year ago. The problem has been resolved since onset. The problem is controlled. Pertinent negatives include no blurred vision, malaise/fatigue, peripheral edema or shortness of breath. Risk factors for coronary artery disease include dyslipidemia, male gender and sedentary lifestyle. The current treatment provides moderate improvement. There is no history of CVA.  Hyperlipidemia This is a chronic problem. The current episode started more than 1 year ago. The problem is controlled. Recent lipid tests were reviewed and are normal. Pertinent negatives include no shortness of breath. Current antihyperlipidemic treatment includes statins. The current treatment provides moderate improvement of lipids. Risk factors for coronary artery disease include diabetes mellitus, hypertension, a sedentary lifestyle, dyslipidemia and male sex.  Diabetes He presents for his follow-up diabetic visit. He has type 2 diabetes mellitus. There are no hypoglycemic associated symptoms. Pertinent negatives for diabetes include no blurred vision and no foot paresthesias. Pertinent negatives for diabetic complications include no CVA, heart disease or peripheral neuropathy. Risk factors for coronary artery disease include diabetes mellitus, dyslipidemia, hypertension and sedentary lifestyle. He is following a generally healthy diet. His overall blood glucose range is 90-110 mg/dl. Eye exam is current.  Back Pain This is a chronic problem. The current episode started more than 1 year ago. The problem occurs intermittently. The problem has been waxing and waning since onset. The pain is present in the lumbar  spine. The quality of the pain is described as aching. The pain is at a severity of 4/10. The pain is moderate.      Review of Systems  Constitutional:  Negative for malaise/fatigue.  Eyes:  Negative for blurred vision.  Respiratory:  Negative for shortness of breath.   Musculoskeletal:  Positive for back pain.  All other systems reviewed and are negative.  Family History  Problem Relation Age of Onset   Cancer Mother        lung   Cancer Brother        leukemia   Social History   Socioeconomic History   Marital status: Single    Spouse name: Not on file   Number of children: Not on file   Years of education: Not on file   Highest education level: Not on file  Occupational History   Not on file  Tobacco Use   Smoking status: Never   Smokeless tobacco: Never  Vaping Use   Vaping Use: Never used  Substance and Sexual Activity   Alcohol use: Yes    Alcohol/week: 1.0 standard drink of alcohol    Types: 1 Shots of liquor per week   Drug use: No   Sexual activity: Yes    Birth control/protection: None  Other Topics Concern   Not on file  Social History Narrative   Not on file   Social Determinants of Health   Financial Resource Strain: Not on file  Food Insecurity: Not on file  Transportation Needs: Not on file  Physical Activity: Not on file  Stress: Not on file  Social Connections: Not on file       Objective:   Physical Exam Vitals reviewed.  Constitutional:  General: He is not in acute distress.    Appearance: He is well-developed.  HENT:     Head: Normocephalic.     Right Ear: Tympanic membrane normal.     Left Ear: Tympanic membrane normal.  Eyes:     General:        Right eye: No discharge.        Left eye: No discharge.     Pupils: Pupils are equal, round, and reactive to light.  Neck:     Thyroid: No thyromegaly.  Cardiovascular:     Rate and Rhythm: Normal rate and regular rhythm.     Heart sounds: Normal heart sounds. No murmur  heard. Pulmonary:     Effort: Pulmonary effort is normal. No respiratory distress.     Breath sounds: Normal breath sounds. No wheezing.  Abdominal:     General: Bowel sounds are normal. There is no distension.     Palpations: Abdomen is soft.     Tenderness: There is no abdominal tenderness.  Musculoskeletal:        General: No tenderness. Normal range of motion.     Cervical back: Normal range of motion and neck supple.  Skin:    General: Skin is warm and dry.     Findings: No erythema or rash.  Neurological:     Mental Status: He is alert and oriented to person, place, and time.     Cranial Nerves: No cranial nerve deficit.     Deep Tendon Reflexes: Reflexes are normal and symmetric.  Psychiatric:        Behavior: Behavior normal.        Thought Content: Thought content normal.        Judgment: Judgment normal.          BP 129/74   Pulse 79   Temp 97.7 F (36.5 C) (Temporal)   Ht 5' 11" (1.803 m)   Wt 204 lb 12.8 oz (92.9 kg)   BMI 28.56 kg/m   Assessment & Plan:  BION TODOROV comes in today with chief complaint of Annual Exam   Diagnosis and orders addressed:  1. Chronic bilateral low back pain without sciatica - meloxicam (MOBIC) 15 MG tablet; Take 1 tablet (15 mg total) by mouth daily.  Dispense: 90 tablet; Refill: 3 - CMP14+EGFR - CBC with Differential/Platelet  2. Essential hypertension - lisinopril (ZESTRIL) 10 MG tablet; Take 1 tablet (10 mg total) by mouth daily.  Dispense: 90 tablet; Refill: 0 - CMP14+EGFR - CBC with Differential/Platelet  3. Erectile dysfunction, unspecified erectile dysfunction type - sildenafil (VIAGRA) 100 MG tablet; Take 0.5-1 tablets (50-100 mg total) by mouth as needed for erectile dysfunction.  Dispense: 30 tablet; Refill: 1 - CMP14+EGFR - CBC with Differential/Platelet  4. Hyperlipidemia, unspecified hyperlipidemia type - atorvastatin (LIPITOR) 20 MG tablet; Take 1 tablet (20 mg total) by mouth daily.  Dispense:  90 tablet; Refill: 3 - CMP14+EGFR - CBC with Differential/Platelet  5. Type 2 diabetes mellitus with other specified complication, without long-term current use of insulin (HCC) - metFORMIN (GLUCOPHAGE) 1000 MG tablet; Take 1 tablet (1,000 mg total) by mouth 2 (two) times daily with a meal.  Dispense: 180 tablet; Refill: 3 - empagliflozin (JARDIANCE) 10 MG TABS tablet; Take 1 tablet (10 mg total) by mouth daily.  Dispense: 90 tablet; Refill: 3 - Bayer DCA Hb A1c Waived - CMP14+EGFR - CBC with Differential/Platelet - Microalbumin / creatinine urine ratio  6. Annual physical exam - Bayer DCA Hb A1c  Waived - CMP14+EGFR - CBC with Differential/Platelet - Lipid panel - PSA, total and free - Microalbumin / creatinine urine ratio - TSH - VITAMIN D 25 Hydroxy (Vit-D Deficiency, Fractures)  7. Hypertension associated with diabetes ([place]) - CMP14+EGFR - CBC with Differential/Platelet  8. Hyperlipidemia associated with type 2 diabetes mellitus (HCC)  - CMP14+EGFR - CBC with Differential/Platelet - Lipid panel  9. Vitamin D deficiency - CMP14+EGFR - CBC with Differential/Platelet - VITAMIN D 25 Hydroxy (Vit-D Deficiency, Fractures)  10. Overweight (BMI 25.0-29.9) - CMP14+EGFR - CBC with Differential/Platelet   Labs pending Health Maintenance reviewed Diet and exercise encouraged  Follow up plan: 6 months    Evelina Dun, FNP

## 2022-01-26 LAB — CBC WITH DIFFERENTIAL/PLATELET
Basophils Absolute: 0 10*3/uL (ref 0.0–0.2)
Basos: 0 %
EOS (ABSOLUTE): 0 10*3/uL (ref 0.0–0.4)
Eos: 1 %
Hematocrit: 47.8 % (ref 37.5–51.0)
Hemoglobin: 16.2 g/dL (ref 13.0–17.7)
Immature Grans (Abs): 0 10*3/uL (ref 0.0–0.1)
Immature Granulocytes: 0 %
Lymphocytes Absolute: 2.7 10*3/uL (ref 0.7–3.1)
Lymphs: 36 %
MCH: 31.4 pg (ref 26.6–33.0)
MCHC: 33.9 g/dL (ref 31.5–35.7)
MCV: 93 fL (ref 79–97)
Monocytes Absolute: 0.9 10*3/uL (ref 0.1–0.9)
Monocytes: 11 %
Neutrophils Absolute: 3.9 10*3/uL (ref 1.4–7.0)
Neutrophils: 52 %
Platelets: 292 10*3/uL (ref 150–450)
RBC: 5.16 x10E6/uL (ref 4.14–5.80)
RDW: 12.2 % (ref 11.6–15.4)
WBC: 7.6 10*3/uL (ref 3.4–10.8)

## 2022-01-26 LAB — LIPID PANEL
Chol/HDL Ratio: 2.9 ratio (ref 0.0–5.0)
Cholesterol, Total: 167 mg/dL (ref 100–199)
HDL: 58 mg/dL (ref >39)
LDL Chol Calc (NIH): 88 mg/dL (ref 0–99)
Triglycerides: 116 mg/dL (ref 0–149)
VLDL Cholesterol Cal: 21 mg/dL (ref 5–40)

## 2022-01-26 LAB — MICROALBUMIN / CREATININE URINE RATIO
Creatinine, Urine: 111.2 mg/dL
Microalb/Creat Ratio: 6 mg/g creat (ref 0–29)
Microalbumin, Urine: 6.7 ug/mL

## 2022-01-26 LAB — CMP14+EGFR
ALT: 23 IU/L (ref 0–44)
AST: 17 IU/L (ref 0–40)
Albumin/Globulin Ratio: 1.7 (ref 1.2–2.2)
Albumin: 4.8 g/dL (ref 3.8–4.9)
Alkaline Phosphatase: 71 IU/L (ref 44–121)
BUN/Creatinine Ratio: 23 — ABNORMAL HIGH (ref 9–20)
BUN: 19 mg/dL (ref 6–24)
Bilirubin Total: 0.6 mg/dL (ref 0.0–1.2)
CO2: 20 mmol/L (ref 20–29)
Calcium: 10.3 mg/dL — ABNORMAL HIGH (ref 8.7–10.2)
Chloride: 102 mmol/L (ref 96–106)
Creatinine, Ser: 0.83 mg/dL (ref 0.76–1.27)
Globulin, Total: 2.8 g/dL (ref 1.5–4.5)
Glucose: 103 mg/dL — ABNORMAL HIGH (ref 70–99)
Potassium: 4.7 mmol/L (ref 3.5–5.2)
Sodium: 139 mmol/L (ref 134–144)
Total Protein: 7.6 g/dL (ref 6.0–8.5)
eGFR: 105 mL/min/{1.73_m2} (ref >59)

## 2022-01-26 LAB — VITAMIN D 25 HYDROXY (VIT D DEFICIENCY, FRACTURES): Vit D, 25-Hydroxy: 27 ng/mL — ABNORMAL LOW (ref 30.0–100.0)

## 2022-01-26 LAB — PSA, TOTAL AND FREE
PSA, Free Pct: 26.7 %
PSA, Free: 0.08 ng/mL
Prostate Specific Ag, Serum: 0.3 ng/mL (ref 0.0–4.0)

## 2022-01-26 LAB — TSH: TSH: 1.73 u[IU]/mL (ref 0.450–4.500)

## 2022-01-28 ENCOUNTER — Other Ambulatory Visit: Payer: Self-pay | Admitting: Family

## 2022-01-28 MED ORDER — VITAMIN D (ERGOCALCIFEROL) 1.25 MG (50000 UNIT) PO CAPS: 50000.0000 [IU] | ORAL_CAPSULE | ORAL | 3 refills | Status: DC

## 2022-04-04 ENCOUNTER — Encounter: Payer: Self-pay | Admitting: Family

## 2022-04-04 ENCOUNTER — Ambulatory Visit: Payer: Managed Care, Other (non HMO) | Admitting: Family

## 2022-04-04 DIAGNOSIS — L03011 Cellulitis of right finger: Secondary | ICD-10-CM

## 2022-04-04 MED ORDER — SULFAMETHOXAZOLE-TRIMETHOPRIM 800-160 MG PO TABS: 1.0000 | ORAL_TABLET | Freq: Two times a day (BID) | ORAL | 0 refills | Status: DC

## 2022-04-04 NOTE — Patient Instructions (Signed)
Paronychia Paronychia is an infection of the skin that surrounds a nail. It usually affects the skin around a fingernail, but it may also occur near a toenail. It often causes pain and swelling around the nail. In some cases, a collection of pus (abscess) can form near or under the nail.  This condition may develop suddenly, or it may develop gradually over a longer period. In most cases, paronychia is not serious, and it will clear up with treatment. What are the causes? This condition may be caused by bacteria or a fungus, such as yeast. The bacteria or fungus can enter the body through an opening in the skin, such as a cut or a hangnail, and cause an infection in your fingernail or toenail. Other causes may include: Recurrent injury to the fingernail or toenail area. Irritation of the base and sides of the nail (cuticle). Injury and irritation can result in inflammation, swelling, and thickened skin around the nail. What increases the risk? This condition is more likely to develop in people who: Get their hands wet often, such as those who work as dishwashers, bartenders, or housekeepers. Bite their fingernails or cuticles. Have underlying skin conditions. Have hangnails or injured fingertips. Are exposed to irritants like detergents and other chemicals. Have diabetes. What are the signs or symptoms? Symptoms of this condition include: Redness and swelling of the skin near the nail. Tenderness around the nail when you touch the area. Pus-filled bumps under the cuticle. Fluid or pus under the nail. Throbbing pain in the area. How is this diagnosed? This condition is diagnosed with a physical exam. In some cases, a sample of pus may be tested to determine what type of bacteria or fungus is causing the condition. How is this treated? Treatment depends on the cause and severity of your condition. If your condition is mild, it may clear up on its own in a few days or after soaking in warm  water. If needed, treatment may include: Antibiotic medicine, if your infection is caused by bacteria. Antifungal medicine, if your infection is caused by a fungus. A procedure to drain pus from an abscess. Anti-inflammatory medicine (corticosteroids). Removal of part of an ingrown toenail. A bandage (dressing) may be placed over the affected area if an abscess or part of a nail has been removed. Follow these instructions at home: Wound care Keep the affected area clean. Soak the affected area in warm water if told to do so by your health care provider. You may be told to do this for 20 minutes, 2-3 times a day. Keep the area dry when you are not soaking it. Do not try to drain an abscess yourself. Follow instructions from your health care provider about how to take care of the affected area. Make sure you: Wash your hands with soap and water for at least 20 seconds before and after you change your dressing. If soap and water are not available, use hand sanitizer. Change your dressing as told by your health care provider. If you had an abscess drained, check the area every day for signs of infection. Check for: Redness, swelling, or pain. Fluid or blood. Warmth. Pus or a bad smell. Medicines  Take over-the-counter and prescription medicines only as told by your health care provider. If you were prescribed an antibiotic medicine, take it as told by your health care provider. Do not stop taking the antibiotic even if you start to feel better. General instructions Avoid contact with any skin irritants or allergens.   Do not pick at the affected area. Keep all follow-up visits as told. This is important. Prevention To prevent this condition from happening again: Wear rubber gloves when washing dishes or doing other tasks that require your hands to get wet. Wear gloves if your hands might come in contact with cleaners or other chemicals. Avoid injuring your nails or fingertips. Do not bite  your nails or tear hangnails. Do not cut your nails very short. Do not cut your cuticles. Use clean nail clippers or scissors when trimming nails. Contact a health care provider if: Your symptoms get worse or do not improve with treatment. You have continued or increased fluid, blood, or pus coming from the affected area. Your affected finger, toe, or joint becomes swollen or difficult to move. You have a fever or chills. There is redness spreading away from the affected area. Summary Paronychia is an infection of the skin that surrounds a nail. It often causes pain and swelling around the nail. In some cases, a collection of pus (abscess) can form near or under the nail. This condition may be caused by bacteria or a fungus. These germs can enter the body through an opening in the skin, such as a cut or a hangnail. If your condition is mild, it may clear up on its own in a few days. If needed, treatment may include medicine or a procedure to drain pus from an abscess. To prevent this condition from happening again, wear gloves if doing tasks that require your hands to get wet or to come in contact with chemicals. Also avoid injuring your nails or fingertips. This information is not intended to replace advice given to you by your health care provider. Make sure you discuss any questions you have with your health care provider. Document Revised: 07/24/2020 Document Reviewed: 07/24/2020 Elsevier Patient Education  2023 [employer] Inc[employer]  

## 2022-04-04 NOTE — Progress Notes (Signed)
Virtual Visit  Note Due to COVID-19 pandemic this visit was conducted virtually. This visit type was conducted due to national recommendations for restrictions regarding the COVID-19 Pandemic (e.g. social distancing, sheltering in place) in an effort to limit this patient's exposure and mitigate transmission in our community. All issues noted in this document were discussed and addressed.  A physical exam was not performed with this format.  I connected with Corey Cruz on 04/04/22 at 11:31 AM by telephone and verified that I am speaking with the correct person using two identifiers. Corey Cruz is currently located at work and no one is currently with him  during visit. The provider, Evelina Dun, FNP is located in their office at time of visit.  I discussed the limitations, risks, security and privacy concerns of performing an evaluation and management service by telephone and the availability of in person appointments. I also discussed with the patient that there may be a patient responsible charge related to this service. The patient expressed understanding and agreed to proceed.  Corey Cruz, Corey Cruz are scheduled for a virtual visit with your provider today.    Just as we do with appointments in the office, we must obtain your consent to participate.  Your consent will be active for this visit and any virtual visit you may have with one of our providers in the next 365 days.    If you have a MyChart account, I can also send a copy of this consent to you electronically.  All virtual visits are billed to your insurance company just like a traditional visit in the office.  As this is a virtual visit, video technology does not allow for your provider to perform a traditional examination.  This may limit your provider's ability to fully assess your condition.  If your provider identifies any concerns that need to be evaluated in person or the need to arrange testing such as labs, EKG,  etc, we will make arrangements to do so.    Although advances in technology are sophisticated, we cannot ensure that it will always work on either your end or our end.  If the connection with a video visit is poor, we may have to switch to a telephone visit.  With either a video or telephone visit, we are not always able to ensure that we have a secure connection.   I need to obtain your verbal consent now.   Are you willing to proceed with your visit today?   Corey Cruz has provided verbal consent on 04/04/2022 for a virtual visit (video or telephone).   Evelina Dun, [place] 04/04/2022  11:32 AM    History and Present Illness:  HPI Pt calls the office the office today right middle finger swelling that started a few weeks ago. States he bites his nails and had a hang nail. States it started to become red, swelling, and had purulent discharge. He took his pocket knife and picked at it. States he drained a white discharge and was feeling better. However, two days ago the swelling, pain, and tenderness return. Reports his pain is a throbbing pain of 8 out 10 when he touches it.   Review of Systems  Skin:        Right middle finger swelling   All other systems reviewed and are negative.    Observations/Objective: No SOB or distress noted   Assessment and Plan: 1. Paronychia of finger of right hand Continue to soak  Avoid picking or  squeezing  Keep clean and dry  Follow up if symptoms worsen or do not improve  - sulfamethoxazole-trimethoprim (BACTRIM DS) 800-160 MG tablet; Take 1 tablet by mouth 2 (two) times daily.  Dispense: 14 tablet; Refill: 0     I discussed the assessment and treatment plan with the patient. The patient was provided an opportunity to ask questions and all were answered. The patient agreed with the plan and demonstrated an understanding of the instructions.   The patient was advised to call back or seek an in-person evaluation if the symptoms worsen or  if the condition fails to improve as anticipated.  The above assessment and management plan was discussed with the patient. The patient verbalized understanding of and has agreed to the management plan. Patient is aware to call the clinic if symptoms persist or worsen. Patient is aware when to return to the clinic for a follow-up visit. Patient educated on when it is appropriate to go to the emergency department.   Time call ended:  11:42 AM   I provided 11 minutes of  non face-to-face time during this encounter.    Evelina Dun, FNP

## 2022-07-05 LAB — HM DIABETES EYE EXAM

## 2022-07-06 ENCOUNTER — Other Ambulatory Visit: Payer: Self-pay | Admitting: Family

## 2022-07-06 DIAGNOSIS — I10 Essential (primary) hypertension: Secondary | ICD-10-CM

## 2022-07-15 ENCOUNTER — Other Ambulatory Visit: Payer: Self-pay | Admitting: Family

## 2022-07-15 DIAGNOSIS — E1169 Type 2 diabetes mellitus with other specified complication: Secondary | ICD-10-CM

## 2022-07-26 ENCOUNTER — Ambulatory Visit: Payer: Managed Care, Other (non HMO) | Admitting: Family

## 2022-08-19 ENCOUNTER — Ambulatory Visit: Payer: Managed Care, Other (non HMO) | Admitting: Family

## 2022-08-19 ENCOUNTER — Encounter: Payer: Self-pay | Admitting: Family

## 2022-08-19 VITALS — BP 128/80 | HR 69 | Temp 97.5°F | Ht 71.0 in | Wt 206.0 lb

## 2022-08-19 DIAGNOSIS — E1159 Type 2 diabetes mellitus with other circulatory complications: Secondary | ICD-10-CM

## 2022-08-19 DIAGNOSIS — E663 Overweight: Secondary | ICD-10-CM

## 2022-08-19 DIAGNOSIS — E785 Hyperlipidemia, unspecified: Secondary | ICD-10-CM | POA: Diagnosis not present

## 2022-08-19 DIAGNOSIS — E1169 Type 2 diabetes mellitus with other specified complication: Secondary | ICD-10-CM

## 2022-08-19 DIAGNOSIS — M545 Low back pain, unspecified: Secondary | ICD-10-CM | POA: Diagnosis not present

## 2022-08-19 DIAGNOSIS — I10 Essential (primary) hypertension: Secondary | ICD-10-CM | POA: Diagnosis not present

## 2022-08-19 DIAGNOSIS — I152 Hypertension secondary to endocrine disorders: Secondary | ICD-10-CM

## 2022-08-19 DIAGNOSIS — E559 Vitamin D deficiency, unspecified: Secondary | ICD-10-CM

## 2022-08-19 DIAGNOSIS — G8929 Other chronic pain: Secondary | ICD-10-CM

## 2022-08-19 DIAGNOSIS — N529 Male erectile dysfunction, unspecified: Secondary | ICD-10-CM

## 2022-08-19 LAB — BAYER DCA HB A1C WAIVED: HB A1C (BAYER DCA - WAIVED): 6.4 % — ABNORMAL HIGH (ref 4.8–5.6)

## 2022-08-19 MED ORDER — LISINOPRIL 10 MG PO TABS: 10.0000 mg | ORAL_TABLET | Freq: Every day | ORAL | 0 refills | Status: DC

## 2022-08-19 MED ORDER — METFORMIN HCL 1000 MG PO TABS: 1000.0000 mg | ORAL_TABLET | Freq: Two times a day (BID) | ORAL | 3 refills | Status: DC

## 2022-08-19 MED ORDER — ATORVASTATIN CALCIUM 20 MG PO TABS: 20.0000 mg | ORAL_TABLET | Freq: Every day | ORAL | 3 refills | Status: DC

## 2022-08-19 MED ORDER — EMPAGLIFLOZIN 10 MG PO TABS: 10.0000 mg | ORAL_TABLET | Freq: Every day | ORAL | 3 refills | Status: DC

## 2022-08-19 MED ORDER — MELOXICAM 15 MG PO TABS: 15.0000 mg | ORAL_TABLET | Freq: Every day | ORAL | 3 refills | Status: DC

## 2022-08-19 NOTE — Patient Instructions (Signed)
Health Maintenance, Male Adopting a healthy lifestyle and getting preventive care are important in promoting health and wellness. Ask your health care provider about: The right schedule for you to have regular tests and exams. Things you can do on your own to prevent diseases and keep yourself healthy. What should I know about diet, weight, and exercise? Eat a healthy diet  Eat a diet that includes plenty of vegetables, fruits, low-fat dairy products, and lean protein. Do not eat a lot of foods that are high in solid fats, added sugars, or sodium. Maintain a healthy weight Body mass index (BMI) is a measurement that can be used to identify possible weight problems. It estimates body fat based on height and weight. Your health care provider can help determine your BMI and help you achieve or maintain a healthy weight. Get regular exercise Get regular exercise. This is one of the most important things you can do for your health. Most adults should: Exercise for at least 150 minutes each week. The exercise should increase your heart rate and make you sweat (moderate-intensity exercise). Do strengthening exercises at least twice a week. This is in addition to the moderate-intensity exercise. Spend less time sitting. Even light physical activity can be beneficial. Watch cholesterol and blood lipids Have your blood tested for lipids and cholesterol at 54 years of age, then have this test every 5 years. You may need to have your cholesterol levels checked more often if: Your lipid or cholesterol levels are high. You are older than 54 years of age. You are at high risk for heart disease. What should I know about cancer screening? Many types of cancers can be detected early and may often be prevented. Depending on your health history and family history, you may need to have cancer screening at various ages. This may include screening for: Colorectal cancer. Prostate cancer. Skin cancer. Lung  cancer. What should I know about heart disease, diabetes, and high blood pressure? Blood pressure and heart disease High blood pressure causes heart disease and increases the risk of stroke. This is more likely to develop in people who have high blood pressure readings or are overweight. Talk with your health care provider about your target blood pressure readings. Have your blood pressure checked: Every 3-5 years if you are 18-39 years of age. Every year if you are 40 years old or older. If you are between the ages of 65 and 75 and are a current or former smoker, ask your health care provider if you should have a one-time screening for abdominal aortic aneurysm (AAA). Diabetes Have regular diabetes screenings. This checks your fasting blood sugar level. Have the screening done: Once every three years after age 45 if you are at a normal weight and have a low risk for diabetes. More often and at a younger age if you are overweight or have a high risk for diabetes. What should I know about preventing infection? Hepatitis B If you have a higher risk for hepatitis B, you should be screened for this virus. Talk with your health care provider to find out if you are at risk for hepatitis B infection. Hepatitis C Blood testing is recommended for: Everyone born from 1945 through 1965. Anyone with known risk factors for hepatitis C. Sexually transmitted infections (STIs) You should be screened each year for STIs, including gonorrhea and chlamydia, if: You are sexually active and are younger than 54 years of age. You are older than 54 years of age and your   health care provider tells you that you are at risk for this type of infection. Your sexual activity has changed since you were last screened, and you are at increased risk for chlamydia or gonorrhea. Ask your health care provider if you are at risk. Ask your health care provider about whether you are at high risk for HIV. Your health care provider  may recommend a prescription medicine to help prevent HIV infection. If you choose to take medicine to prevent HIV, you should first get tested for HIV. You should then be tested every 3 months for as long as you are taking the medicine. Follow these instructions at home: Alcohol use Do not drink alcohol if your health care provider tells you not to drink. If you drink alcohol: Limit how much you have to 0-2 drinks a day. Know how much alcohol is in your drink. In the [place] one drink equals one 12 oz bottle of beer (355 mL), one 5 oz glass of wine (148 mL), or one 1 oz glass of hard liquor (44 mL). Lifestyle Do not use any products that contain nicotine or tobacco. These products include cigarettes, chewing tobacco, and vaping devices, such as e-cigarettes. If you need help quitting, ask your health care provider. Do not use street drugs. Do not share needles. Ask your health care provider for help if you need support or information about quitting drugs. General instructions Schedule regular health, dental, and eye exams. Stay current with your vaccines. Tell your health care provider if: You often feel depressed. You have ever been abused or do not feel safe at home. Summary Adopting a healthy lifestyle and getting preventive care are important in promoting health and wellness. Follow your health care provider's instructions about healthy diet, exercising, and getting tested or screened for diseases. Follow your health care provider's instructions on monitoring your cholesterol and blood pressure. This information is not intended to replace advice given to you by your health care provider. Make sure you discuss any questions you have with your health care provider. Document Revised: 09/11/2020 Document Reviewed: 09/11/2020 Elsevier Patient Education  2023 [employer]  

## 2022-08-19 NOTE — Progress Notes (Signed)
Subjective:    Patient ID: Corey Cruz, male    DOB: 1971-01-12, 52 y.o.   MRN: [id number]  Chief Complaint  Patient presents with   Medical Management of Chronic Issues   Pt presents to the office today chronic follow. He has   ED and takes Viagra as needed that works well.  Hypertension This is a chronic problem. The current episode started more than 1 year ago. The problem has been resolved since onset. The problem is controlled. Pertinent negatives include no blurred vision, malaise/fatigue, peripheral edema or shortness of breath. Risk factors for coronary artery disease include dyslipidemia, obesity and male gender. The current treatment provides moderate improvement.  Hyperlipidemia This is a chronic problem. The current episode started more than 1 year ago. The problem is controlled. Recent lipid tests were reviewed and are normal. Exacerbating diseases include obesity. Pertinent negatives include no shortness of breath. Current antihyperlipidemic treatment includes statins. The current treatment provides moderate improvement of lipids. Risk factors for coronary artery disease include diabetes mellitus, dyslipidemia, hypertension, male sex and a sedentary lifestyle.  Diabetes He presents for his follow-up diabetic visit. He has type 2 diabetes mellitus. Pertinent negatives for diabetes include no blurred vision and no foot paresthesias. Symptoms are stable. Risk factors for coronary artery disease include dyslipidemia, diabetes mellitus, hypertension, male sex and sedentary lifestyle. He is following a generally healthy diet. His overall blood glucose range is 90-110 mg/dl. Eye exam is current.  Back Pain This is a chronic problem. The current episode started more than 1 year ago. The problem occurs intermittently. The problem has been waxing and waning since onset. The pain is present in the lumbar spine. The quality of the pain is described as aching. The pain is at a severity of  5/10. The pain is moderate. He has tried NSAIDs for the symptoms. The treatment provided mild relief.      Review of Systems  Constitutional:  Negative for malaise/fatigue.  Eyes:  Negative for blurred vision.  Respiratory:  Negative for shortness of breath.   Musculoskeletal:  Positive for back pain.  All other systems reviewed and are negative.      Objective:   Physical Exam Vitals reviewed.  Constitutional:      General: He is not in acute distress.    Appearance: He is well-developed.  HENT:     Head: Normocephalic.     Right Ear: Tympanic membrane normal.     Left Ear: Tympanic membrane normal.  Eyes:     General:        Right eye: No discharge.        Left eye: No discharge.     Pupils: Pupils are equal, round, and reactive to light.  Neck:     Thyroid: No thyromegaly.  Cardiovascular:     Rate and Rhythm: Normal rate and regular rhythm.     Heart sounds: Normal heart sounds. No murmur heard. Pulmonary:     Effort: Pulmonary effort is normal. No respiratory distress.     Breath sounds: Normal breath sounds. No wheezing.  Abdominal:     General: Bowel sounds are normal. There is no distension.     Palpations: Abdomen is soft.     Tenderness: There is no abdominal tenderness.  Musculoskeletal:        General: No tenderness. Normal range of motion.     Cervical back: Normal range of motion and neck supple.  Skin:    General: Skin is warm and  dry.     Findings: No erythema or rash.  Neurological:     Mental Status: He is alert and oriented to person, place, and time.     Cranial Nerves: No cranial nerve deficit.     Deep Tendon Reflexes: Reflexes are normal and symmetric.  Psychiatric:        Behavior: Behavior normal.        Thought Content: Thought content normal.        Judgment: Judgment normal.        BP 128/80   Pulse 69   Temp (!) 97.5 F (36.4 C) (Temporal)   Ht 5\' 11"  (1.803 m)   Wt 206 lb (93.4 kg)   SpO2 96%   BMI 28.73 kg/m       Assessment & Plan:  KASTIEL GADBOIS comes in today with chief complaint of Medical Management of Chronic Issues   Diagnosis and orders addressed:  1. Hyperlipidemia, unspecified hyperlipidemia type - atorvastatin (LIPITOR) 20 MG tablet; Take 1 tablet (20 mg total) by mouth daily.  Dispense: 90 tablet; Refill: 3 - CMP14+EGFR  2. Type 2 diabetes mellitus with other specified complication, without long-term current use of insulin - empagliflozin (JARDIANCE) 10 MG TABS tablet; Take 1 tablet (10 mg total) by mouth daily.  Dispense: 90 tablet; Refill: 3 - metFORMIN (GLUCOPHAGE) 1000 MG tablet; Take 1 tablet (1,000 mg total) by mouth 2 (two) times daily with a meal.  Dispense: 180 tablet; Refill: 3 - Bayer DCA Hb A1c Waived - CMP14+EGFR  3. Essential hypertension - lisinopril (ZESTRIL) 10 MG tablet; Take 1 tablet (10 mg total) by mouth daily.  Dispense: 90 tablet; Refill: 0 - CMP14+EGFR  4. Chronic bilateral low back pain without sciatica - meloxicam (MOBIC) 15 MG tablet; Take 1 tablet (15 mg total) by mouth daily.  Dispense: 90 tablet; Refill: 3 - CMP14+EGFR  5. Hyperlipidemia associated with type 2 diabetes mellitus  6. Vitamin D deficiency  7. Overweight (BMI 25.0-29.9)  8. Hypertension associated with diabetes (HCC)  9. Erectile dysfunction, unspecified erectile dysfunction type   Labs pending Continue medications Health Maintenance reviewed Diet and exercise encouraged  Follow up plan: 6 months   [name], FNP

## 2022-08-20 LAB — CMP14+EGFR
ALT: 20 IU/L (ref 0–44)
AST: 19 IU/L (ref 0–40)
Albumin/Globulin Ratio: 1.8 (ref 1.2–2.2)
Albumin: 4.6 g/dL (ref 3.8–4.9)
Alkaline Phosphatase: 72 IU/L (ref 44–121)
BUN/Creatinine Ratio: 26 — ABNORMAL HIGH (ref 9–20)
BUN: 21 mg/dL (ref 6–24)
Bilirubin Total: 0.4 mg/dL (ref 0.0–1.2)
CO2: 23 mmol/L (ref 20–29)
Calcium: 10.7 mg/dL — ABNORMAL HIGH (ref 8.7–10.2)
Chloride: 98 mmol/L (ref 96–106)
Creatinine, Ser: 0.8 mg/dL (ref 0.76–1.27)
Globulin, Total: 2.5 g/dL (ref 1.5–4.5)
Glucose: 95 mg/dL (ref 70–99)
Potassium: 4.5 mmol/L (ref 3.5–5.2)
Sodium: 135 mmol/L (ref 134–144)
Total Protein: 7.1 g/dL (ref 6.0–8.5)
eGFR: 106 mL/min/{1.73_m2} (ref >59)

## 2022-12-29 ENCOUNTER — Other Ambulatory Visit: Payer: Self-pay | Admitting: Family

## 2022-12-29 DIAGNOSIS — I10 Essential (primary) hypertension: Secondary | ICD-10-CM

## 2023-01-13 ENCOUNTER — Encounter: Payer: Managed Care, Other (non HMO) | Admitting: Family

## 2023-03-07 ENCOUNTER — Encounter (INDEPENDENT_AMBULATORY_CARE_PROVIDER_SITE_OTHER): Payer: Managed Care, Other (non HMO)

## 2023-03-07 DIAGNOSIS — N529 Male erectile dysfunction, unspecified: Secondary | ICD-10-CM | POA: Diagnosis not present

## 2023-03-10 MED ORDER — TADALAFIL 20 MG PO TABS: 10.0000 mg | ORAL_TABLET | ORAL | 11 refills | Status: DC | PRN

## 2023-03-10 NOTE — Telephone Encounter (Signed)
Hello, I sent in a prescription for Cialis 20 mg. You can take 10 mg to 20 mg before sexual activity to see if this works better for you.   [name], FNP   Approximately 5 minutes was spent documenting and reviewing patient's chart.

## 2023-03-30 ENCOUNTER — Other Ambulatory Visit: Payer: Self-pay | Admitting: Family

## 2023-03-30 DIAGNOSIS — I10 Essential (primary) hypertension: Secondary | ICD-10-CM

## 2023-05-02 ENCOUNTER — Telehealth (INDEPENDENT_AMBULATORY_CARE_PROVIDER_SITE_OTHER): Payer: Managed Care, Other (non HMO) | Admitting: Family

## 2023-05-02 ENCOUNTER — Other Ambulatory Visit: Payer: Managed Care, Other (non HMO)

## 2023-05-02 ENCOUNTER — Encounter: Payer: Self-pay | Admitting: Family

## 2023-05-02 VITALS — BP 120/80

## 2023-05-02 DIAGNOSIS — M545 Low back pain, unspecified: Secondary | ICD-10-CM

## 2023-05-02 DIAGNOSIS — E663 Overweight: Secondary | ICD-10-CM

## 2023-05-02 DIAGNOSIS — E559 Vitamin D deficiency, unspecified: Secondary | ICD-10-CM | POA: Diagnosis not present

## 2023-05-02 DIAGNOSIS — Z7984 Long term (current) use of oral hypoglycemic drugs: Secondary | ICD-10-CM

## 2023-05-02 DIAGNOSIS — E785 Hyperlipidemia, unspecified: Secondary | ICD-10-CM

## 2023-05-02 DIAGNOSIS — I152 Hypertension secondary to endocrine disorders: Secondary | ICD-10-CM

## 2023-05-02 DIAGNOSIS — E1159 Type 2 diabetes mellitus with other circulatory complications: Secondary | ICD-10-CM

## 2023-05-02 DIAGNOSIS — N529 Male erectile dysfunction, unspecified: Secondary | ICD-10-CM

## 2023-05-02 DIAGNOSIS — E1169 Type 2 diabetes mellitus with other specified complication: Secondary | ICD-10-CM | POA: Diagnosis not present

## 2023-05-02 DIAGNOSIS — G8929 Other chronic pain: Secondary | ICD-10-CM

## 2023-05-02 DIAGNOSIS — I10 Essential (primary) hypertension: Secondary | ICD-10-CM

## 2023-05-02 LAB — BAYER DCA HB A1C WAIVED: HB A1C (BAYER DCA - WAIVED): 6 % — ABNORMAL HIGH (ref 4.8–5.6)

## 2023-05-02 LAB — LIPID PANEL

## 2023-05-02 MED ORDER — LISINOPRIL 10 MG PO TABS: 10.0000 mg | ORAL_TABLET | Freq: Every day | ORAL | 0 refills | Status: DC

## 2023-05-02 MED ORDER — EMPAGLIFLOZIN 10 MG PO TABS: 10.0000 mg | ORAL_TABLET | Freq: Every day | ORAL | 3 refills | Status: DC

## 2023-05-02 MED ORDER — MELOXICAM 15 MG PO TABS: 15.0000 mg | ORAL_TABLET | Freq: Every day | ORAL | 3 refills | Status: DC

## 2023-05-02 MED ORDER — ATORVASTATIN CALCIUM 20 MG PO TABS: 20.0000 mg | ORAL_TABLET | Freq: Every day | ORAL | 3 refills | Status: DC

## 2023-05-02 MED ORDER — TADALAFIL 20 MG PO TABS: 10.0000 mg | ORAL_TABLET | ORAL | 11 refills | Status: DC | PRN

## 2023-05-02 MED ORDER — METFORMIN HCL 1000 MG PO TABS: 1000.0000 mg | ORAL_TABLET | Freq: Two times a day (BID) | ORAL | 3 refills | Status: DC

## 2023-05-02 NOTE — Progress Notes (Signed)
Virtual Visit Consent   Corey Cruz, you are scheduled for a virtual visit with a [facility] provider today. Just as with appointments in the office, your consent must be obtained to participate. Your consent will be active for this visit and any virtual visit you may have with one of our providers in the next 365 days. If you have a MyChart account, a copy of this consent can be sent to you electronically.  As this is a virtual visit, video technology does not allow for your provider to perform a traditional examination. This may limit your provider's ability to fully assess your condition. If your provider identifies any concerns that need to be evaluated in person or the need to arrange testing (such as labs, EKG, etc.), we will make arrangements to do so. Although advances in technology are sophisticated, we cannot ensure that it will always work on either your end or our end. If the connection with a video visit is poor, the visit may have to be switched to a telephone visit. With either a video or telephone visit, we are not always able to ensure that we have a secure connection.  By engaging in this virtual visit, you consent to the provision of healthcare and authorize for your insurance to be billed (if applicable) for the services provided during this visit. Depending on your insurance coverage, you may receive a charge related to this service.  I need to obtain your verbal consent now. Are you willing to proceed with your visit today? CHAO WILTGEN has provided verbal consent on 05/02/2023 for a virtual visit (video or telephone). Corey Rodney, FNP  Date: 05/02/2023 8:24 AM  Virtual Visit via Video Note   I, Corey Cruz, connected with  Corey Cruz  ([id number], Apr 14, 1971) on 05/02/23 at  8:10 AM EST by a video-enabled telemedicine application and verified that I am speaking with the correct person using two identifiers.  Location: Patient: Virtual Visit  Location Patient: Home Provider: Virtual Visit Location Provider: Home Office   I discussed the limitations of evaluation and management by telemedicine and the availability of in person appointments. The patient expressed understanding and agreed to proceed.    History of Present Illness: Corey Cruz is a 52 y.o. who identifies as a male who was assigned male at birth, and is being seen today for chronic follow. He has   ED and takes Viagra as needed that works well. Marland Kitchen  HPI: Hypertension This is a chronic problem. The current episode started more than 1 year ago. The problem has been resolved since onset. The problem is controlled. Pertinent negatives include no blurred vision, malaise/fatigue, peripheral edema or shortness of breath. Risk factors for coronary artery disease include dyslipidemia, diabetes mellitus and male gender. The current treatment provides moderate improvement.  Hyperlipidemia This is a chronic problem. The current episode started more than 1 year ago. The problem is controlled. Recent lipid tests were reviewed and are normal. Pertinent negatives include no shortness of breath. Current antihyperlipidemic treatment includes statins. The current treatment provides moderate improvement of lipids. Risk factors for coronary artery disease include dyslipidemia, diabetes mellitus, hypertension, male sex and a sedentary lifestyle.  Diabetes He presents for his follow-up diabetic visit. He has type 2 diabetes mellitus. Pertinent negatives for diabetes include no blurred vision and no foot paresthesias. Risk factors for coronary artery disease include diabetes mellitus, hypertension, sedentary lifestyle, male sex and dyslipidemia. He is following a generally healthy diet. His overall blood  glucose range is 90-110 mg/dl. Eye exam is current.  Back Pain This is a chronic problem. The current episode started more than 1 year ago. The problem occurs intermittently. The pain is present  in the lumbar spine. The quality of the pain is described as aching. The pain is at a severity of 2/10. The pain is mild. He has tried NSAIDs for the symptoms. The treatment provided moderate relief.    Problems:  Patient Active Problem List   Diagnosis Date Noted   Inguinal hernia bilateral, non-recurrent 04/16/2017   Right inguinal hernia    Left inguinal hernia    Chronic back pain 07/12/2016   Erectile dysfunction 07/12/2016   Overweight (BMI 25.0-29.9) 12/11/2015   Vitamin D deficiency 07/04/2014   Hypertension associated with diabetes (HCC)    Hyperlipidemia associated with type 2 diabetes mellitus (HCC) 08/21/2010   Diabetes mellitus (HCC) 08/21/2010    Allergies: No Known Allergies Medications:  Current Outpatient Medications:    aspirin EC 81 MG tablet, Take 1 tablet (81 mg total) by mouth daily., Disp: 90 tablet, Rfl: 1   atorvastatin (LIPITOR) 20 MG tablet, Take 1 tablet (20 mg total) by mouth daily., Disp: 90 tablet, Rfl: 3   empagliflozin (JARDIANCE) 10 MG TABS tablet, Take 1 tablet (10 mg total) by mouth daily., Disp: 90 tablet, Rfl: 3   lisinopril (ZESTRIL) 10 MG tablet, Take 1 tablet (10 mg total) by mouth daily., Disp: 90 tablet, Rfl: 0   meloxicam (MOBIC) 15 MG tablet, Take 1 tablet (15 mg total) by mouth daily., Disp: 90 tablet, Rfl: 3   metFORMIN (GLUCOPHAGE) 1000 MG tablet, Take 1 tablet (1,000 mg total) by mouth 2 (two) times daily with a meal., Disp: 180 tablet, Rfl: 3   tadalafil (CIALIS) 20 MG tablet, Take 0.5-1 tablets (10-20 mg total) by mouth every other day as needed for erectile dysfunction., Disp: 10 tablet, Rfl: 11   Vitamin D, Ergocalciferol, (DRISDOL) 1.25 MG (50000 UNIT) CAPS capsule, Take 1 capsule (50,000 Units total) by mouth every 7 (seven) days., Disp: 12 capsule, Rfl: 3  Observations/Objective: Patient is well-developed, well-nourished in no acute distress.  Resting comfortably  at home.  Head is normocephalic, atraumatic.  No labored  breathing.  Speech is clear and coherent with logical content.  Patient is alert and oriented at baseline.    Assessment and Plan: 1. Type 2 diabetes mellitus with other specified complication, without long-term current use of insulin (HCC) (Primary) - Bayer DCA Hb A1c Waived; Future - CBC with Differential/Platelet; Future - CMP14+EGFR; Future - Microalbumin / creatinine urine ratio; Future - empagliflozin (JARDIANCE) 10 MG TABS tablet; Take 1 tablet (10 mg total) by mouth daily.  Dispense: 90 tablet; Refill: 3 - metFORMIN (GLUCOPHAGE) 1000 MG tablet; Take 1 tablet (1,000 mg total) by mouth 2 (two) times daily with a meal.  Dispense: 180 tablet; Refill: 3  2. Hypertension associated with diabetes (HCC) - lisinopril (ZESTRIL) 10 MG tablet; Take 1 tablet (10 mg total) by mouth daily.  Dispense: 90 tablet; Refill: 0 - CBC with Differential/Platelet; Future - CMP14+EGFR; Future  3. Hyperlipidemia associated with type 2 diabetes mellitus (HCC) - CBC with Differential/Platelet; Future - CMP14+EGFR; Future - atorvastatin (LIPITOR) 20 MG tablet; Take 1 tablet (20 mg total) by mouth daily.  Dispense: 90 tablet; Refill: 3 - Lipid panel; Future  4. Vitamin D deficiency - CBC with Differential/Platelet; Future - CMP14+EGFR; Future  5. Overweight (BMI 25.0-29.9) - CBC with Differential/Platelet; Future - CMP14+EGFR; Future  6.  Chronic bilateral low back pain without sciatica - CBC with Differential/Platelet; Future - CMP14+EGFR; Future - meloxicam (MOBIC) 15 MG tablet; Take 1 tablet (15 mg total) by mouth daily.  Dispense: 90 tablet; Refill: 3  7. Erectile dysfunction, unspecified erectile dysfunction type - CBC with Differential/Platelet; Future - CMP14+EGFR; Future - tadalafil (CIALIS) 20 MG tablet; Take 0.5-1 tablets (10-20 mg total) by mouth every other day as needed for erectile dysfunction.  Dispense: 10 tablet; Refill: 11  8. Essential hypertension - CBC with  Differential/Platelet; Future - CMP14+EGFR; Future  9. Hyperlipidemia, unspecified hyperlipidemia type - CBC with Differential/Platelet; Future - CMP14+EGFR; Future  Labs pending  Continue current medications  Healthy diet and exercise  Follow up 6 months   Follow Up Instructions: I discussed the assessment and treatment plan with the patient. The patient was provided an opportunity to ask questions and all were answered. The patient agreed with the plan and demonstrated an understanding of the instructions.  A copy of instructions were sent to the patient via MyChart unless otherwise noted below.     The patient was advised to call back or seek an in-person evaluation if the symptoms worsen or if the condition fails to improve as anticipated.    Corey Rodney, FNP

## 2023-05-03 LAB — LIPID PANEL
Cholesterol, Total: 168 mg/dL (ref 100–199)
HDL: 48 mg/dL (ref >39)
LDL CALC COMMENT:: 3.5 ratio (ref 0.0–5.0)
LDL Chol Calc (NIH): 80 mg/dL (ref 0–99)
Triglycerides: 240 mg/dL — ABNORMAL HIGH (ref 0–149)
VLDL Cholesterol Cal: 40 mg/dL (ref 5–40)

## 2023-05-03 LAB — CBC WITH DIFFERENTIAL/PLATELET
Basophils Absolute: 0 10*3/uL (ref 0.0–0.2)
Basos: 0 %
EOS (ABSOLUTE): 0.1 10*3/uL (ref 0.0–0.4)
Eos: 1 %
Hematocrit: 49.8 % (ref 37.5–51.0)
Hemoglobin: 17 g/dL (ref 13.0–17.7)
Immature Grans (Abs): 0 10*3/uL (ref 0.0–0.1)
Immature Granulocytes: 0 %
Lymphocytes Absolute: 3.3 10*3/uL — ABNORMAL HIGH (ref 0.7–3.1)
Lymphs: 35 %
MCH: 32.1 pg (ref 26.6–33.0)
MCHC: 34.1 g/dL (ref 31.5–35.7)
MCV: 94 fL (ref 79–97)
Monocytes Absolute: 1 10*3/uL — ABNORMAL HIGH (ref 0.1–0.9)
Monocytes: 10 %
Neutrophils Absolute: 5.1 10*3/uL (ref 1.4–7.0)
Neutrophils: 54 %
Platelets: 296 10*3/uL (ref 150–450)
RBC: 5.29 x10E6/uL (ref 4.14–5.80)
RDW: 12.2 % (ref 11.6–15.4)
WBC: 9.5 10*3/uL (ref 3.4–10.8)

## 2023-05-03 LAB — CMP14+EGFR
ALT: 31 IU/L (ref 0–44)
AST: 24 IU/L (ref 0–40)
Albumin: 4.8 g/dL (ref 3.8–4.9)
Alkaline Phosphatase: 77 IU/L (ref 44–121)
BUN/Creatinine Ratio: 25 — ABNORMAL HIGH (ref 9–20)
BUN: 19 mg/dL (ref 6–24)
Bilirubin Total: 0.5 mg/dL (ref 0.0–1.2)
CO2: 21 mmol/L (ref 20–29)
Calcium: 10.4 mg/dL — ABNORMAL HIGH (ref 8.7–10.2)
Chloride: 97 mmol/L (ref 96–106)
Creatinine, Ser: 0.75 mg/dL — ABNORMAL LOW (ref 0.76–1.27)
Globulin, Total: 2.5 g/dL (ref 1.5–4.5)
Glucose: 90 mg/dL (ref 70–99)
Potassium: 4.1 mmol/L (ref 3.5–5.2)
Sodium: 134 mmol/L (ref 134–144)
Total Protein: 7.3 g/dL (ref 6.0–8.5)
eGFR: 107 mL/min/{1.73_m2} (ref >59)

## 2023-05-03 LAB — MICROALBUMIN / CREATININE URINE RATIO
Creatinine, Urine: 74.2 mg/dL
Microalb/Creat Ratio: <4 mg/g{creat} (ref 0–29)
Microalbumin, Urine: <3 ug/mL

## 2023-09-21 ENCOUNTER — Other Ambulatory Visit: Payer: Self-pay | Admitting: Family

## 2023-09-21 DIAGNOSIS — I152 Hypertension secondary to endocrine disorders: Secondary | ICD-10-CM

## 2023-09-27 ENCOUNTER — Other Ambulatory Visit: Payer: Self-pay | Admitting: Family

## 2023-09-27 DIAGNOSIS — N529 Male erectile dysfunction, unspecified: Secondary | ICD-10-CM

## 2023-10-31 ENCOUNTER — Encounter: Payer: Self-pay | Admitting: Family

## 2023-10-31 ENCOUNTER — Ambulatory Visit (INDEPENDENT_AMBULATORY_CARE_PROVIDER_SITE_OTHER): Payer: Managed Care, Other (non HMO) | Admitting: Family

## 2023-10-31 VITALS — BP 118/61 | HR 85 | Temp 97.2°F | Ht 71.0 in | Wt 207.0 lb

## 2023-10-31 DIAGNOSIS — E1169 Type 2 diabetes mellitus with other specified complication: Secondary | ICD-10-CM | POA: Diagnosis not present

## 2023-10-31 DIAGNOSIS — Z23 Encounter for immunization: Secondary | ICD-10-CM

## 2023-10-31 DIAGNOSIS — I152 Hypertension secondary to endocrine disorders: Secondary | ICD-10-CM

## 2023-10-31 DIAGNOSIS — M545 Low back pain, unspecified: Secondary | ICD-10-CM

## 2023-10-31 DIAGNOSIS — E1159 Type 2 diabetes mellitus with other circulatory complications: Secondary | ICD-10-CM

## 2023-10-31 DIAGNOSIS — G8929 Other chronic pain: Secondary | ICD-10-CM

## 2023-10-31 DIAGNOSIS — Z0001 Encounter for general adult medical examination with abnormal findings: Secondary | ICD-10-CM

## 2023-10-31 DIAGNOSIS — E559 Vitamin D deficiency, unspecified: Secondary | ICD-10-CM

## 2023-10-31 DIAGNOSIS — Z0000 Encounter for general adult medical examination without abnormal findings: Secondary | ICD-10-CM

## 2023-10-31 DIAGNOSIS — N529 Male erectile dysfunction, unspecified: Secondary | ICD-10-CM

## 2023-10-31 DIAGNOSIS — E663 Overweight: Secondary | ICD-10-CM

## 2023-10-31 DIAGNOSIS — E785 Hyperlipidemia, unspecified: Secondary | ICD-10-CM

## 2023-10-31 LAB — BAYER DCA HB A1C WAIVED: HB A1C (BAYER DCA - WAIVED): 6.5 % — ABNORMAL HIGH (ref 4.8–5.6)

## 2023-10-31 NOTE — Progress Notes (Signed)
 Subjective:    Patient ID: Corey Cruz, male    DOB: Apr 06, 1971, 53 y.o.   MRN: [id number]  Chief Complaint  Patient presents with   Annual Exam   Pt presents to the office today CPE and  chronic follow. He has   ED and takes Viagra  as needed that works well.  Hypertension This is a chronic problem. The current episode started more than 1 year ago. The problem has been waxing and waning since onset. The problem is uncontrolled. Pertinent negatives include no blurred vision, malaise/fatigue, peripheral edema or shortness of breath. Risk factors for coronary artery disease include dyslipidemia, obesity and male gender. The current treatment provides moderate improvement.  Hyperlipidemia This is a chronic problem. The current episode started more than 1 year ago. The problem is controlled. Recent lipid tests were reviewed and are normal. Exacerbating diseases include obesity. Pertinent negatives include no shortness of breath. Current antihyperlipidemic treatment includes statins. The current treatment provides moderate improvement of lipids. Risk factors for coronary artery disease include diabetes mellitus, dyslipidemia, hypertension, male sex and a sedentary lifestyle.  Diabetes He presents for his follow-up diabetic visit. He has type 2 diabetes mellitus. Pertinent negatives for diabetes include no blurred vision and no foot paresthesias. Symptoms are stable. Risk factors for coronary artery disease include dyslipidemia, diabetes mellitus, hypertension, male sex and sedentary lifestyle. He is following a generally healthy diet. His overall blood glucose range is 90-110 mg/dl. Eye exam is current.  Back Pain This is a chronic problem. The current episode started more than 1 year ago. The problem occurs intermittently. The problem has been waxing and waning since onset. The pain is present in the lumbar spine. The quality of the pain is described as aching. The pain is at a severity of 5/10.  The pain is moderate. He has tried NSAIDs for the symptoms. The treatment provided mild relief.      Review of Systems  Constitutional:  Negative for malaise/fatigue.  Eyes:  Negative for blurred vision.  Respiratory:  Negative for shortness of breath.   Musculoskeletal:  Positive for back pain.  All other systems reviewed and are negative.  Family History  Problem Relation Age of Onset   Cancer Mother        lung   Cancer Brother        leukemia   Social History   Socioeconomic History   Marital status: Single    Spouse name: Not on file   Number of children: Not on file   Years of education: Not on file   Highest education level: Not on file  Occupational History   Not on file  Tobacco Use   Smoking status: Never   Smokeless tobacco: Never  Vaping Use   Vaping status: Never Used  Substance and Sexual Activity   Alcohol use: Yes    Alcohol/week: 1.0 standard drink of alcohol    Types: 1 Shots of liquor per week   Drug use: No   Sexual activity: Yes    Birth control/protection: None  Other Topics Concern   Not on file  Social History Narrative   Not on file   Social Drivers of Health   Financial Resource Strain: Not on file  Food Insecurity: Not on file  Transportation Needs: Not on file  Physical Activity: Not on file  Stress: Not on file  Social Connections: Unknown (09/17/2021)   Received from [facility]   Social Network    Social Network: Not on  file       Objective:   Physical Exam Vitals reviewed.  Constitutional:      General: He is not in acute distress.    Appearance: He is well-developed.  HENT:     Head: Normocephalic.     Right Ear: Tympanic membrane normal.     Left Ear: Tympanic membrane normal.   Eyes:     General:        Right eye: No discharge.        Left eye: No discharge.     Pupils: Pupils are equal, round, and reactive to light.   Neck:     Thyroid : No thyromegaly.   Cardiovascular:     Rate and Rhythm: Normal  rate and regular rhythm.     Heart sounds: Normal heart sounds. No murmur heard. Pulmonary:     Effort: Pulmonary effort is normal. No respiratory distress.     Breath sounds: Normal breath sounds. No wheezing.  Abdominal:     General: Bowel sounds are normal. There is no distension.     Palpations: Abdomen is soft.     Tenderness: There is no abdominal tenderness.   Musculoskeletal:        General: No tenderness. Normal range of motion.     Cervical back: Normal range of motion and neck supple.   Skin:    General: Skin is warm and dry.     Findings: No erythema or rash.   Neurological:     Mental Status: He is alert and oriented to person, place, and time.     Cranial Nerves: No cranial nerve deficit.     Deep Tendon Reflexes: Reflexes are normal and symmetric.   Psychiatric:        Behavior: Behavior normal.        Thought Content: Thought content normal.        Judgment: Judgment normal.      Diabetic Foot Exam - Simple   Simple Foot Form Diabetic Foot exam was performed with the following findings: Yes 10/31/2023  3:18 PM  Visual Inspection No deformities, no ulcerations, no other skin breakdown bilaterally: Yes Sensation Testing Intact to touch and monofilament testing bilaterally: Yes Pulse Check Posterior Tibialis and Dorsalis pulse intact bilaterally: Yes Comments      BP 118/61   Pulse 85   Temp (!) 97.2 F (36.2 C) (Temporal)   Ht 5' 11 (1.803 m)   Wt 207 lb (93.9 kg)   SpO2 94%   BMI 28.87 kg/m      Assessment & Plan:  Corey Cruz comes in today with chief complaint of Annual Exam   Diagnosis and orders addressed:  1. Immunization due - Pneumococcal conjugate vaccine 20-valent (Prevnar 20) - CMP14+EGFR - CBC with Differential/Platelet  2. Annual physical exam (Primary) - Bayer DCA Hb A1c Waived - CMP14+EGFR - CBC with Differential/Platelet - Lipid panel - PSA, total and free - TSH - Vitamin B12  3. Type 2 diabetes mellitus  with other specified complication, without long-term current use of insulin (HCC) - Bayer DCA Hb A1c Waived - CMP14+EGFR - CBC with Differential/Platelet - TSH - Vitamin B12  4. Hyperlipidemia associated with type 2 diabetes mellitus (HCC)  - CMP14+EGFR - CBC with Differential/Platelet - Lipid panel  5. Hypertension associated with diabetes (HCC) - CMP14+EGFR - CBC with Differential/Platelet  6. Vitamin D  deficiency - CMP14+EGFR - CBC with Differential/Platelet - VITAMIN D  25 Hydroxy (Vit-D Deficiency, Fractures)  7. Overweight (BMI 25.0-29.9) -  CMP14+EGFR - CBC with Differential/Platelet  8. Chronic bilateral low back pain without sciatica - CMP14+EGFR - CBC with Differential/Platelet  9. Erectile dysfunction, unspecified erectile dysfunction type - CMP14+EGFR - CBC with Differential/Platelet  Labs pending Continue medications Health Maintenance reviewed Diet and exercise encouraged  Follow up plan: 6 months   Bari Learn, FNP

## 2023-10-31 NOTE — Patient Instructions (Signed)
 Health Maintenance, Male  Adopting a healthy lifestyle and getting preventive care are important in promoting health and wellness. Ask your health care provider about:  The right schedule for you to have regular tests and exams.  Things you can do on your own to prevent diseases and keep yourself healthy.  What should I know about diet, weight, and exercise?  Eat a healthy diet    Eat a diet that includes plenty of vegetables, fruits, low-fat dairy products, and lean protein.  Do not eat a lot of foods that are high in solid fats, added sugars, or sodium.  Maintain a healthy weight  Body mass index (BMI) is a measurement that can be used to identify possible weight problems. It estimates body fat based on height and weight. Your health care provider can help determine your BMI and help you achieve or maintain a healthy weight.  Get regular exercise  Get regular exercise. This is one of the most important things you can do for your health. Most adults should:  Exercise for at least 150 minutes each week. The exercise should increase your heart rate and make you sweat (moderate-intensity exercise).  Do strengthening exercises at least twice a week. This is in addition to the moderate-intensity exercise.  Spend less time sitting. Even light physical activity can be beneficial.  Watch cholesterol and blood lipids  Have your blood tested for lipids and cholesterol at 55 years of age, then have this test every 5 years.  You may need to have your cholesterol levels checked more often if:  Your lipid or cholesterol levels are high.  You are older than 55 years of age.  You are at high risk for heart disease.  What should I know about cancer screening?  Many types of cancers can be detected early and may often be prevented. Depending on your health history and family history, you may need to have cancer screening at various ages. This may include screening for:  Colorectal cancer.  Prostate cancer.  Skin cancer.  Lung  cancer.  What should I know about heart disease, diabetes, and high blood pressure?  Blood pressure and heart disease  High blood pressure causes heart disease and increases the risk of stroke. This is more likely to develop in people who have high blood pressure readings or are overweight.  Talk with your health care provider about your target blood pressure readings.  Have your blood pressure checked:  Every 3-5 years if you are 9-95 years of age.  Every year if you are 85 years old or older.  If you are between the ages of 29 and 29 and are a current or former smoker, ask your health care provider if you should have a one-time screening for abdominal aortic aneurysm (AAA).  Diabetes  Have regular diabetes screenings. This checks your fasting blood sugar level. Have the screening done:  Once every three years after age 23 if you are at a normal weight and have a low risk for diabetes.  More often and at a younger age if you are overweight or have a high risk for diabetes.  What should I know about preventing infection?  Hepatitis B  If you have a higher risk for hepatitis B, you should be screened for this virus. Talk with your health care provider to find out if you are at risk for hepatitis B infection.  Hepatitis C  Blood testing is recommended for:  Everyone born from [birth year] through 1967.  Anyone  with known risk factors for hepatitis C.  Sexually transmitted infections (STIs)  You should be screened each year for STIs, including gonorrhea and chlamydia, if:  You are sexually active and are younger than 55 years of age.  You are older than 55 years of age and your health care provider tells you that you are at risk for this type of infection.  Your sexual activity has changed since you were last screened, and you are at increased risk for chlamydia or gonorrhea. Ask your health care provider if you are at risk.  Ask your health care provider about whether you are at high risk for HIV. Your health care provider  may recommend a prescription medicine to help prevent HIV infection. If you choose to take medicine to prevent HIV, you should first get tested for HIV. You should then be tested every 3 months for as long as you are taking the medicine.  Follow these instructions at home:  Alcohol use  Do not drink alcohol if your health care provider tells you not to drink.  If you drink alcohol:  Limit how much you have to 0-2 drinks a day.  Know how much alcohol is in your drink. In the [place] one drink equals one 12 oz bottle of beer (355 mL), one 5 oz glass of wine (148 mL), or one 1 oz glass of hard liquor (44 mL).  Lifestyle  Do not use any products that contain nicotine or tobacco. These products include cigarettes, chewing tobacco, and vaping devices, such as e-cigarettes. If you need help quitting, ask your health care provider.  Do not use street drugs.  Do not share needles.  Ask your health care provider for help if you need support or information about quitting drugs.  General instructions  Schedule regular health, dental, and eye exams.  Stay current with your vaccines.  Tell your health care provider if:  You often feel depressed.  You have ever been abused or do not feel safe at home.  Summary  Adopting a healthy lifestyle and getting preventive care are important in promoting health and wellness.  Follow your health care provider's instructions about healthy diet, exercising, and getting tested or screened for diseases.  Follow your health care provider's instructions on monitoring your cholesterol and blood pressure.  This information is not intended to replace advice given to you by your health care provider. Make sure you discuss any questions you have with your health care provider.  Document Revised: 09/11/2020 Document Reviewed: 09/11/2020  Elsevier Patient Education  2024 [employer].

## 2023-11-01 LAB — CMP14+EGFR
ALT: 25 IU/L (ref 0–44)
AST: 20 IU/L (ref 0–40)
Albumin: 4.5 g/dL (ref 3.8–4.9)
Alkaline Phosphatase: 65 IU/L (ref 44–121)
BUN/Creatinine Ratio: 23 — ABNORMAL HIGH (ref 9–20)
BUN: 19 mg/dL (ref 6–24)
Bilirubin Total: 0.4 mg/dL (ref 0.0–1.2)
CO2: 18 mmol/L — ABNORMAL LOW (ref 20–29)
Calcium: 9.6 mg/dL (ref 8.7–10.2)
Chloride: 99 mmol/L (ref 96–106)
Creatinine, Ser: 0.83 mg/dL (ref 0.76–1.27)
Globulin, Total: 2.3 g/dL (ref 1.5–4.5)
Glucose: 189 mg/dL — ABNORMAL HIGH (ref 70–99)
Potassium: 3.8 mmol/L (ref 3.5–5.2)
Sodium: 133 mmol/L — ABNORMAL LOW (ref 134–144)
Total Protein: 6.8 g/dL (ref 6.0–8.5)
eGFR: 104 mL/min/{1.73_m2} (ref >59)

## 2023-11-01 LAB — PSA, TOTAL AND FREE
PSA, Free Pct: 40 %
PSA, Free: 0.04 ng/mL
Prostate Specific Ag, Serum: 0.1 ng/mL (ref 0.0–4.0)

## 2023-11-01 LAB — VITAMIN D 25 HYDROXY (VIT D DEFICIENCY, FRACTURES): Vit D, 25-Hydroxy: 21.7 ng/mL — ABNORMAL LOW (ref 30.0–100.0)

## 2023-11-01 LAB — LIPID PANEL
Chol/HDL Ratio: 4.6 ratio (ref 0.0–5.0)
Cholesterol, Total: 174 mg/dL (ref 100–199)
HDL: 38 mg/dL — ABNORMAL LOW (ref >39)
LDL Chol Calc (NIH): 68 mg/dL (ref 0–99)
Triglycerides: 435 mg/dL — ABNORMAL HIGH (ref 0–149)
VLDL Cholesterol Cal: 68 mg/dL — ABNORMAL HIGH (ref 5–40)

## 2023-11-01 LAB — CBC WITH DIFFERENTIAL/PLATELET
Basophils Absolute: 0 10*3/uL (ref 0.0–0.2)
Basos: 0 %
EOS (ABSOLUTE): 0 10*3/uL (ref 0.0–0.4)
Eos: 1 %
Hematocrit: 47.2 % (ref 37.5–51.0)
Hemoglobin: 15.8 g/dL (ref 13.0–17.7)
Immature Grans (Abs): 0 10*3/uL (ref 0.0–0.1)
Immature Granulocytes: 0 %
Lymphocytes Absolute: 2.9 10*3/uL (ref 0.7–3.1)
Lymphs: 39 %
MCH: 32 pg (ref 26.6–33.0)
MCHC: 33.5 g/dL (ref 31.5–35.7)
MCV: 96 fL (ref 79–97)
Monocytes Absolute: 0.8 10*3/uL (ref 0.1–0.9)
Monocytes: 10 %
Neutrophils Absolute: 3.7 10*3/uL (ref 1.4–7.0)
Neutrophils: 50 %
Platelets: 305 10*3/uL (ref 150–450)
RBC: 4.94 x10E6/uL (ref 4.14–5.80)
RDW: 12.5 % (ref 11.6–15.4)
WBC: 7.5 10*3/uL (ref 3.4–10.8)

## 2023-11-01 LAB — TSH: TSH: 2.14 u[IU]/mL (ref 0.450–4.500)

## 2023-11-01 LAB — VITAMIN B12: Vitamin B-12: 291 pg/mL (ref 232–1245)

## 2023-11-04 ENCOUNTER — Other Ambulatory Visit: Payer: Self-pay | Admitting: Family

## 2023-11-04 ENCOUNTER — Ambulatory Visit: Payer: Self-pay | Admitting: Family

## 2023-11-04 DIAGNOSIS — E1169 Type 2 diabetes mellitus with other specified complication: Secondary | ICD-10-CM

## 2023-11-04 MED ORDER — VITAMIN D (ERGOCALCIFEROL) 1.25 MG (50000 UNIT) PO CAPS: 50000.0000 [IU] | ORAL_CAPSULE | ORAL | 3 refills | Status: AC

## 2023-11-06 MED ORDER — DEXCOM G7 RECEIVER DEVI: 1.0000 | 1 refills | Status: DC

## 2023-11-10 MED ORDER — DEXCOM G7 SENSOR MISC: 3 refills | Status: DC

## 2023-11-10 NOTE — Addendum Note (Signed)
 Addended by: Steadman Prosperi G on: 11/10/2023 01:32 PM   Modules accepted: Orders

## 2023-11-11 ENCOUNTER — Other Ambulatory Visit (HOSPITAL_COMMUNITY): Payer: Self-pay

## 2023-11-11 ENCOUNTER — Telehealth: Payer: Self-pay

## 2023-11-11 NOTE — Telephone Encounter (Signed)
 Pharmacy Patient Advocate Encounter   Received notification from Onbase that prior authorization for Dexcom G7 Receiver device  is required/requested.   Insurance verification completed.   The patient is insured through [employer] .   Per test claim: PA required; PA submitted to above mentioned insurance via CoverMyMeds Key/confirmation #/EOC [id number] Status is pending

## 2023-11-17 NOTE — Telephone Encounter (Signed)
 Patient aware and verbalized understanding.

## 2023-11-17 NOTE — Telephone Encounter (Signed)
 Please let patient know dexcom was denied.

## 2023-11-17 NOTE — Telephone Encounter (Signed)
 Pharmacy Patient Advocate Encounter  Received notification from [employer] that Prior Authorization for [facility] G7 Receiver device  has been DENIED.  See denial reason below. No denial letter attached in CMM. Will attach denial letter to Media tab once received.   PA #/Case ID/Reference #: [id number]

## 2023-11-21 ENCOUNTER — Encounter: Admitting: Family

## 2023-12-01 ENCOUNTER — Ambulatory Visit: Payer: Self-pay

## 2023-12-01 NOTE — Telephone Encounter (Signed)
 FYI Only or Action Required?: FYI only for provider.  Patient was last seen in primary care on 10/31/2023 by Lavell Bari LABOR, FNP.  Called Nurse Triage reporting Rectal Bleeding.  Symptoms began today.  Interventions attempted: Nothing.  Symptoms are: stable.  Triage Disposition: Home Care  Patient/caregiver understands and will follow disposition?: Yes     Copied from CRM #[id number]. Topic: Clinical - Red Word Triage >> Dec 01, 2023 10:51 AM Carlatta H wrote: [facility] that prompted transfer to Nurse Triage: Bright red blood in stool this morning Reason for Disposition  Rectal bleeding is minimal (e.g., blood just on toilet paper, a few drops in toilet bowl)  Answer Assessment - Initial Assessment Questions 1. APPEARANCE of BLOOD: What color is it? Is it passed separately, on the surface of the stool, or mixed in with the stool?      Bright red blood  2. AMOUNT: How much blood was passed?      Patient states the tissue paper had blood on it but unsure if the water had blood in it  3. FREQUENCY: How many times has blood been passed with the stools?      One bowel movement with the blood this morning  4. ONSET: When was the blood first seen in the stools? (Days or weeks)      This morning  5. DIARRHEA: Is there also some diarrhea? If Yes, ask: How many diarrhea stools in the past 24 hours?      No  6. CONSTIPATION: Do you have constipation? If Yes, ask: How bad is it?     No  7. RECURRENT SYMPTOMS: Have you had blood in your stools before? If Yes, ask: When was the last time? and What happened that time?      No  8. BLOOD THINNERS: Do you take any blood thinners? (e.g., aspirin , clopidogrel / Plavix, coumadin, heparin). Notes: Other strong blood thinners include: Arixtra (fondaparinux), Eliquis (apixaban), Pradaxa (dabigatran), and Xarelto (rivaroxaban).     Baby aspirin  daily  9. OTHER SYMPTOMS: Do you have any other symptoms?  (e.g., abdomen  pain, vomiting, dizziness, fever)       Denies abdominal pain, dizziness, fever, cold/flu, vomiting  Protocols used: Rectal Bleeding-A-AH

## 2023-12-21 ENCOUNTER — Other Ambulatory Visit: Payer: Self-pay | Admitting: Family

## 2023-12-21 DIAGNOSIS — E1159 Type 2 diabetes mellitus with other circulatory complications: Secondary | ICD-10-CM

## 2024-01-23 ENCOUNTER — Telehealth: Admitting: Physician Assistant

## 2024-01-23 DIAGNOSIS — L03019 Cellulitis of unspecified finger: Secondary | ICD-10-CM

## 2024-01-23 NOTE — Progress Notes (Signed)
  Because of your photo and symptoms, I feel your condition warrants further evaluation and I recommend that you be seen in a face-to-face visit.   NOTE: There will be NO CHARGE for this E-Visit   If you are having a true medical emergency, please call 911.     For an urgent face to face visit, [facility] has multiple urgent care centers for your convenience.  Click the link below for the full list of locations and hours, walk-in wait times, appointment scheduling options and driving directions:  Urgent Care - [place], [place], [place], [place], [place], [place]  [place]     Your MyChart E-visit questionnaire answers were reviewed by a board certified advanced clinical practitioner to complete your personal care plan based on your specific symptoms.    Thank you for using e-Visits.

## 2024-01-29 ENCOUNTER — Encounter: Payer: Self-pay | Admitting: Family

## 2024-01-29 ENCOUNTER — Ambulatory Visit: Admitting: Family

## 2024-01-29 VITALS — BP 119/83 | HR 82 | Temp 97.8°F | Ht 71.0 in | Wt 207.8 lb

## 2024-01-29 DIAGNOSIS — Z23 Encounter for immunization: Secondary | ICD-10-CM

## 2024-01-29 DIAGNOSIS — L03012 Cellulitis of left finger: Secondary | ICD-10-CM

## 2024-01-29 DIAGNOSIS — E1169 Type 2 diabetes mellitus with other specified complication: Secondary | ICD-10-CM | POA: Diagnosis not present

## 2024-01-29 DIAGNOSIS — B3749 Other urogenital candidiasis: Secondary | ICD-10-CM

## 2024-01-29 MED ORDER — CEPHALEXIN 500 MG PO CAPS: 500.0000 mg | ORAL_CAPSULE | Freq: Three times a day (TID) | ORAL | 0 refills | Status: DC

## 2024-01-29 MED ORDER — KETOCONAZOLE 2 % EX CREA: 1.0000 | TOPICAL_CREAM | Freq: Every day | CUTANEOUS | 0 refills | Status: DC

## 2024-01-29 MED ORDER — FLUCONAZOLE 150 MG PO TABS: 150.0000 mg | ORAL_TABLET | ORAL | 0 refills | Status: DC | PRN

## 2024-01-29 NOTE — Progress Notes (Signed)
 Subjective:    Patient ID: Corey Cruz, male    DOB: May 29, 1970, 53 y.o.   MRN: [id number]  Chief Complaint  Patient presents with   Personal Problem    Yeast    finger infection     HPI PT presents to the office today rash on his penis last week. He states he ate a lot of sugar last week, he is taking Jardiance  10 mg. He reports skin is erythemas and very tender.    He reports swelling and erythemas around left middle finger that is draining green discharge. He has been soaking his finger with mild relief.   He is a diabetic and his last A1C was 6.5.    Review of Systems  All other systems reviewed and are negative.   Social History   Socioeconomic History   Marital status: Single    Spouse name: Not on file   Number of children: Not on file   Years of education: Not on file   Highest education level: 12th grade  Occupational History   Not on file  Tobacco Use   Smoking status: Never   Smokeless tobacco: Never  Vaping Use   Vaping status: Never Used  Substance and Sexual Activity   Alcohol use: Yes    Alcohol/week: 1.0 standard drink of alcohol    Types: 1 Shots of liquor per week   Drug use: No   Sexual activity: Yes    Birth control/protection: None  Other Topics Concern   Not on file  Social History Narrative   Not on file   Social Drivers of Health   Financial Resource Strain: Low Risk  (01/27/2024)   Overall Financial Resource Strain (CARDIA)    Difficulty of Paying Living Expenses: Not hard at all  Food Insecurity: No Food Insecurity (01/27/2024)   Hunger Vital Sign    Worried About Running Out of Food in the Last Year: Never true    Ran Out of Food in the Last Year: Never true  Transportation Needs: No Transportation Needs (01/27/2024)   PRAPARE - Administrator, Civil Service (Medical): No    Lack of Transportation (Non-Medical): No  Physical Activity: Insufficiently Active (01/27/2024)   Exercise Vital Sign    Days of  Exercise per Week: 2 days    Minutes of Exercise per Session: 30 min  Stress: No Stress Concern Present (01/27/2024)   [employer] of Occupational Health - Occupational Stress Questionnaire    Feeling of Stress: Not at all  Social Connections: Moderately Isolated (01/27/2024)   Social Connection and Isolation Panel    Frequency of Communication with Friends and Family: More than three times a week    Frequency of Social Gatherings with Friends and Family: Once a week    Attends Religious Services: Never    Database administrator or Organizations: No    Attends Engineer, structural: Not on file    Marital Status: Living with partner   Family History  Problem Relation Age of Onset   Cancer Mother        lung   Cancer Brother        leukemia        Objective:   Physical Exam Vitals reviewed.  Constitutional:      General: He is not in acute distress.    Appearance: He is well-developed.  HENT:     Head: Normocephalic.     Right Ear: Tympanic membrane and external ear  normal.     Left Ear: Tympanic membrane and external ear normal.  Eyes:     General:        Right eye: No discharge.        Left eye: No discharge.     Pupils: Pupils are equal, round, and reactive to light.  Neck:     Thyroid : No thyromegaly.  Cardiovascular:     Rate and Rhythm: Normal rate and regular rhythm.     Heart sounds: Normal heart sounds. No murmur heard. Pulmonary:     Effort: Pulmonary effort is normal. No respiratory distress.     Breath sounds: Normal breath sounds. No wheezing.  Abdominal:     General: Bowel sounds are normal. There is no distension.     Palpations: Abdomen is soft.     Tenderness: There is no abdominal tenderness.     Hernia: A hernia is present.  Genitourinary:    Comments: Foreskin erythemas  Musculoskeletal:        General: No tenderness. Normal range of motion.     Cervical back: Normal range of motion and neck supple.  Skin:    General: Skin is  warm and dry.     Findings: No erythema or rash.     Comments: Left medial middle finger erythemas, tender, and swelling  Neurological:     Mental Status: He is alert and oriented to person, place, and time.     Cranial Nerves: No cranial nerve deficit.     Deep Tendon Reflexes: Reflexes are normal and symmetric.  Psychiatric:        Behavior: Behavior normal.        Thought Content: Thought content normal.        Judgment: Judgment normal.       BP 119/83   Pulse 82   Temp 97.8 F (36.6 C) (Temporal)   Ht 5' 11 (1.803 m)   Wt 207 lb 12.8 oz (94.3 kg)   BMI 28.98 kg/m      Assessment & Plan:  [name] comes in today with chief complaint of Personal Problem (Yeast ) and finger infection    Diagnosis and orders addressed:  1. Encounter for immunization - Flu vaccine trivalent PF, 6mos and older(Flulaval,Afluria,Fluarix,Fluzone)  2. Paronychia of finger of left hand (Primary) Start Keflex  TID prn  Soak finger Avoid picking or squeezing  - cephALEXin  (KEFLEX ) 500 MG capsule; Take 1 capsule (500 mg total) by mouth 3 (three) times daily.  Dispense: 21 capsule; Refill: 0  3. Yeast dermatitis of penis Start diflucan  every three days prn Ketoconazole  cream  Strict low carb diet  Discussed using disposable wipes after urinating  - fluconazole  (DIFLUCAN ) 150 MG tablet; Take 1 tablet (150 mg total) by mouth every three (3) days as needed.  Dispense: 3 tablet; Refill: 0 - ketoconazole  (NIZORAL ) 2 % cream; Apply 1 Application topically daily.  Dispense: 15 g; Refill: 0  4. Type 2 diabetes mellitus with other specified complication, without long-term current use of insulin (HCC) Strict low carb diet       Bari Learn, FNP

## 2024-01-29 NOTE — Patient Instructions (Signed)
Genital Yeast Infection, Male In men, a genital yeast infection is a condition that causes soreness, swelling, and redness (inflammation) of the head of the penis (glans penis). A genital yeast infection can be spread through sexual contact, but it can also develop without sexual contact. If the infection is not treated properly, it is likely to come back. What are the causes? This condition is caused by a change in the normal balance of the yeast and bacteria that live on the skin. This change causes an overgrowth of yeast, which causes the inflammation. Many types of yeast can cause this infection, but Candida is the most common. What increases the risk? The following factors may make you more likely to develop this condition: Taking antibiotic medicines. Having diabetes. Being exposed to the infection by a sexual partner. Being uncircumcised. Having a weak body defense system (immune system). Taking steroid medicines for a long time. Having poor hygiene. What are the signs or symptoms? Symptoms of this condition include: Itching of the groin and penis. Dry, red, or cracked skin on the penis. Swelling of the genital area. Pain while urinating or trouble urinating. Thick, bad-smelling discharge on the penis. How is this diagnosed? This condition may be diagnosed based on: Your medical history. A physical exam. You may also have tests, such as: Test of a sample of discharge from the penis. Urine tests. Blood tests. How is this treated? This condition is treated with: Antifungal creams or medicines. Antifungal medicines may be prescribed by your health care provider or they may be available over the counter. Self-care at home. For men who are not circumcised, circumcision may be recommended to control infections that return and are difficult to treat. Follow these instructions at home: Medicines  Take or apply over-the-counter and prescription medicines only as told by your health  care provider. Take your antifungal medicine as told by your health care provider. Do not stop taking the medicine even if you start to feel better. Self-care Wash your penis with soap and water every day. If you are not circumcised, pull back the foreskin to wash. Make sure to dry your penis completely after washing. Take sitz baths as told by your health care provider. This may help ease swelling and redness. Wear breathable, cotton underwear. Keep your underwear clean and dry. General instructions Do not have sex until your health care provider has approved. Tell your sexual partner that you have a yeast infection. That person should go for treatment even if no symptoms are present. If you have diabetes, keep your blood sugar levels within your target range. Keep all follow-up visits. This is important. Contact a health care provider if: You have a fever. You have symptoms that go away and then return. You do not get better with treatment. You have symptoms that get worse. You have new symptoms. Get help right away if: Your swelling and inflammation become so severe that you cannot urinate. Summary In men, a genital yeast infection is a condition that causes soreness, swelling, and redness (inflammation) of the head of the penis (glans penis). This condition is caused by a change in the normal balance of the yeast and bacteria that live on the skin. This change causes an overgrowth of yeast, which causes the inflammation. A genital yeast infection usually spreads through sexual contact, but it can develop without sexual contact. For instance, you may be more likely to develop this infection if you take antibiotics or steroids, have diabetes, are not circumcised, have a  weak immune system, or have poor hygiene. This condition is treated with antifungal cream or pills along with self-care at home. This information is not intended to replace advice given to you by your health care provider.  Make sure you discuss any questions you have with your health care provider. Document Revised: 04/18/2021 Document Reviewed: 04/18/2021 Elsevier Patient Education  2024 [employer].

## 2024-05-03 ENCOUNTER — Ambulatory Visit: Payer: Self-pay | Admitting: Family

## 2024-06-04 ENCOUNTER — Encounter: Payer: Self-pay | Admitting: Family

## 2024-06-04 ENCOUNTER — Ambulatory Visit: Admitting: Family

## 2024-06-04 VITALS — BP 115/82 | HR 80 | Temp 97.0°F | Ht 71.0 in | Wt 202.4 lb

## 2024-06-04 DIAGNOSIS — E663 Overweight: Secondary | ICD-10-CM

## 2024-06-04 DIAGNOSIS — G8929 Other chronic pain: Secondary | ICD-10-CM

## 2024-06-04 DIAGNOSIS — E1169 Type 2 diabetes mellitus with other specified complication: Secondary | ICD-10-CM | POA: Diagnosis not present

## 2024-06-04 DIAGNOSIS — E1159 Type 2 diabetes mellitus with other circulatory complications: Secondary | ICD-10-CM

## 2024-06-04 DIAGNOSIS — E559 Vitamin D deficiency, unspecified: Secondary | ICD-10-CM

## 2024-06-04 DIAGNOSIS — N529 Male erectile dysfunction, unspecified: Secondary | ICD-10-CM

## 2024-06-04 DIAGNOSIS — M545 Low back pain, unspecified: Secondary | ICD-10-CM | POA: Diagnosis not present

## 2024-06-04 DIAGNOSIS — Z7984 Long term (current) use of oral hypoglycemic drugs: Secondary | ICD-10-CM | POA: Diagnosis not present

## 2024-06-04 DIAGNOSIS — E785 Hyperlipidemia, unspecified: Secondary | ICD-10-CM | POA: Diagnosis not present

## 2024-06-04 DIAGNOSIS — I152 Hypertension secondary to endocrine disorders: Secondary | ICD-10-CM | POA: Diagnosis not present

## 2024-06-04 LAB — BAYER DCA HB A1C WAIVED: HB A1C (BAYER DCA - WAIVED): 6.1 % — ABNORMAL HIGH (ref 4.8–5.6)

## 2024-06-04 MED ORDER — EMPAGLIFLOZIN 10 MG PO TABS: 10.0000 mg | ORAL_TABLET | Freq: Every day | ORAL | 3 refills | Status: AC

## 2024-06-04 MED ORDER — TADALAFIL 20 MG PO TABS: 10.0000 mg | ORAL_TABLET | ORAL | 11 refills | Status: AC | PRN

## 2024-06-04 MED ORDER — MELOXICAM 15 MG PO TABS: 15.0000 mg | ORAL_TABLET | Freq: Every day | ORAL | 3 refills | Status: AC

## 2024-06-04 MED ORDER — LISINOPRIL 10 MG PO TABS: 10.0000 mg | ORAL_TABLET | Freq: Every day | ORAL | 1 refills | Status: AC

## 2024-06-04 MED ORDER — ATORVASTATIN CALCIUM 20 MG PO TABS: 20.0000 mg | ORAL_TABLET | Freq: Every day | ORAL | 3 refills | Status: AC

## 2024-06-04 MED ORDER — METFORMIN HCL 1000 MG PO TABS: 1000.0000 mg | ORAL_TABLET | Freq: Two times a day (BID) | ORAL | 3 refills | Status: AC

## 2024-06-04 NOTE — Patient Instructions (Signed)

## 2024-06-04 NOTE — Progress Notes (Signed)
 "  Subjective:    Patient ID: Corey Cruz, male    DOB: 14-Dec-1970, 54 y.o.   MRN: [id number]  Chief Complaint  Patient presents with   Anticoagulation   Pt presents to the office today chronic follow.   He has ED and takes Viagra  as needed that works well.  Hypertension This is a chronic problem. The current episode started more than 1 year ago. The problem has been resolved since onset. The problem is controlled. Associated symptoms include peripheral edema (some times). Pertinent negatives include no blurred vision, malaise/fatigue or shortness of breath. Risk factors for coronary artery disease include dyslipidemia, obesity and male gender. The current treatment provides moderate improvement.  Hyperlipidemia This is a chronic problem. The current episode started more than 1 year ago. The problem is controlled. Recent lipid tests were reviewed and are normal. Exacerbating diseases include obesity. Pertinent negatives include no shortness of breath. Current antihyperlipidemic treatment includes statins. The current treatment provides moderate improvement of lipids. Risk factors for coronary artery disease include diabetes mellitus, dyslipidemia, hypertension, male sex and a sedentary lifestyle.  Diabetes He presents for his follow-up diabetic visit. He has type 2 diabetes mellitus. Pertinent negatives for diabetes include no blurred vision and no foot paresthesias. Symptoms are stable. Risk factors for coronary artery disease include dyslipidemia, diabetes mellitus, hypertension, male sex and sedentary lifestyle. He is following a generally healthy diet. His overall blood glucose range is 90-110 mg/dl. Eye exam is current.  Back Pain This is a chronic problem. The current episode started more than 1 year ago. The problem occurs intermittently. The problem has been waxing and waning since onset. The pain is present in the lumbar spine. The quality of the pain is described as aching. The  pain is at a severity of 5/10. The pain is moderate. The symptoms are aggravated by standing, twisting and bending. He has tried NSAIDs for the symptoms. The treatment provided mild relief.      Review of Systems  Constitutional:  Negative for malaise/fatigue.  Eyes:  Negative for blurred vision.  Respiratory:  Negative for shortness of breath.   Musculoskeletal:  Positive for back pain.  All other systems reviewed and are negative.  Family History  Problem Relation Age of Onset   Cancer Mother        lung   Cancer Brother        leukemia   Social History   Socioeconomic History   Marital status: Single    Spouse name: Not on file   Number of children: Not on file   Years of education: Not on file   Highest education level: 12th grade  Occupational History   Not on file  Tobacco Use   Smoking status: Never   Smokeless tobacco: Never  Vaping Use   Vaping status: Never Used  Substance and Sexual Activity   Alcohol use: Yes    Alcohol/week: 1.0 standard drink of alcohol    Types: 1 Shots of liquor per week   Drug use: No   Sexual activity: Yes    Birth control/protection: None  Other Topics Concern   Not on file  Social History Narrative   Not on file   Social Drivers of Health   Tobacco Use: Low Risk (06/04/2024)   Patient History    Smoking Tobacco Use: Never    Smokeless Tobacco Use: Never    Passive Exposure: Not on file  Financial Resource Strain: Low Risk (01/27/2024)   Overall Financial Resource  Strain (CARDIA)    Difficulty of Paying Living Expenses: Not hard at all  Food Insecurity: No Food Insecurity (01/27/2024)   Epic    Worried About Programme Researcher, Broadcasting/film/video in the Last Year: Never true    Ran Out of Food in the Last Year: Never true  Transportation Needs: No Transportation Needs (01/27/2024)   Epic    Lack of Transportation (Medical): No    Lack of Transportation (Non-Medical): No  Physical Activity: Insufficiently Active (01/27/2024)   Exercise Vital  Sign    Days of Exercise per Week: 2 days    Minutes of Exercise per Session: 30 min  Stress: No Stress Concern Present (01/27/2024)   [employer] of Occupational Health - Occupational Stress Questionnaire    Feeling of Stress: Not at all  Social Connections: Moderately Isolated (01/27/2024)   Social Connection and Isolation Panel    Frequency of Communication with Friends and Family: More than three times a week    Frequency of Social Gatherings with Friends and Family: Once a week    Attends Religious Services: Never    Database Administrator or Organizations: No    Attends Engineer, Structural: Not on file    Marital Status: Living with partner  Depression (PHQ2-9): Low Risk (06/04/2024)   Depression (PHQ2-9)    PHQ-2 Score: 0  Alcohol Screen: Low Risk (01/27/2024)   Alcohol Screen    Last Alcohol Screening Score (AUDIT): 1  Housing: Unknown (01/27/2024)   Epic    Unable to Pay for Housing in the Last Year: No    Number of Times Moved in the Last Year: Not on file    Homeless in the Last Year: No  Utilities: Not on file  Health Literacy: Not on file       Objective:   Physical Exam Vitals reviewed.  Constitutional:      General: He is not in acute distress.    Appearance: He is well-developed.  HENT:     Head: Normocephalic.     Right Ear: Tympanic membrane normal.     Left Ear: Tympanic membrane normal.  Eyes:     General:        Right eye: No discharge.        Left eye: No discharge.     Pupils: Pupils are equal, round, and reactive to light.  Neck:     Thyroid : No thyromegaly.  Cardiovascular:     Rate and Rhythm: Normal rate and regular rhythm.     Heart sounds: Normal heart sounds. No murmur heard. Pulmonary:     Effort: Pulmonary effort is normal. No respiratory distress.     Breath sounds: Normal breath sounds. No wheezing.  Abdominal:     General: Bowel sounds are normal. There is no distension.     Palpations: Abdomen is soft.      Tenderness: There is no abdominal tenderness.  Musculoskeletal:        General: No tenderness. Normal range of motion.     Cervical back: Normal range of motion and neck supple.  Skin:    General: Skin is warm and dry.     Findings: No erythema or rash.  Neurological:     Mental Status: He is alert and oriented to person, place, and time.     Cranial Nerves: No cranial nerve deficit.     Deep Tendon Reflexes: Reflexes are normal and symmetric.  Psychiatric:        Behavior: Behavior  normal.        Thought Content: Thought content normal.        Judgment: Judgment normal.      BP 115/82   Pulse 80   Temp (!) 97 F (36.1 C) (Temporal)   Ht 5' 11 (1.803 m)   Wt 202 lb 6.4 oz (91.8 kg)   BMI 28.23 kg/m      Assessment & Plan:  Corey Cruz comes in today with chief complaint of Anticoagulation   Diagnosis and orders addressed:  1. Type 2 diabetes mellitus with other specified complication, without long-term current use of insulin (HCC) (Primary) - Bayer DCA Hb A1c Waived - CBC with Differential/Platelet - CMP14+EGFR - empagliflozin  (JARDIANCE ) 10 MG TABS tablet; Take 1 tablet (10 mg total) by mouth daily.  Dispense: 90 tablet; Refill: 3 - metFORMIN  (GLUCOPHAGE ) 1000 MG tablet; Take 1 tablet (1,000 mg total) by mouth 2 (two) times daily with a meal.  Dispense: 180 tablet; Refill: 3 - Microalbumin / creatinine urine ratio  2. Hypertension associated with diabetes (HCC) - CBC with Differential/Platelet - CMP14+EGFR - lisinopril  (ZESTRIL ) 10 MG tablet; Take 1 tablet (10 mg total) by mouth daily.  Dispense: 90 tablet; Refill: 1  3. Hyperlipidemia associated with type 2 diabetes mellitus (HCC) - Lipid panel - atorvastatin  (LIPITOR) 20 MG tablet; Take 1 tablet (20 mg total) by mouth daily.  Dispense: 90 tablet; Refill: 3  4. Chronic bilateral low back pain without sciatica - meloxicam  (MOBIC ) 15 MG tablet; Take 1 tablet (15 mg total) by mouth daily.  Dispense: 90  tablet; Refill: 3  5. Erectile dysfunction, unspecified erectile dysfunction type - tadalafil  (CIALIS ) 20 MG tablet; Take 0.5-1 tablets (10-20 mg total) by mouth every other day as needed for erectile dysfunction.  Dispense: 10 tablet; Refill: 11  6. Vitamin D  deficiency   7. Overweight (BMI 25.0-29.9)   Labs pending Continue medications Health Maintenance reviewed Diet and exercise encouraged  Follow up plan: 6 months   Bari Learn, FNP    "

## 2024-06-05 LAB — CBC WITH DIFFERENTIAL/PLATELET
Basophils Absolute: 0 10*3/uL (ref 0.0–0.2)
Basos: 0 %
EOS (ABSOLUTE): 0.1 10*3/uL (ref 0.0–0.4)
Eos: 1 %
Hematocrit: 47.8 % (ref 37.5–51.0)
Hemoglobin: 16.6 g/dL (ref 13.0–17.7)
Immature Grans (Abs): 0 10*3/uL (ref 0.0–0.1)
Immature Granulocytes: 0 %
Lymphocytes Absolute: 2.8 10*3/uL (ref 0.7–3.1)
Lymphs: 39 %
MCH: 32.7 pg (ref 26.6–33.0)
MCHC: 34.7 g/dL (ref 31.5–35.7)
MCV: 94 fL (ref 79–97)
Monocytes Absolute: 0.8 10*3/uL (ref 0.1–0.9)
Monocytes: 11 %
Neutrophils Absolute: 3.4 10*3/uL (ref 1.4–7.0)
Neutrophils: 49 %
Platelets: 297 10*3/uL (ref 150–450)
RBC: 5.08 x10E6/uL (ref 4.14–5.80)
RDW: 12.2 % (ref 11.6–15.4)
WBC: 7 10*3/uL (ref 3.4–10.8)

## 2024-06-05 LAB — CMP14+EGFR
ALT: 26 [IU]/L (ref 0–44)
AST: 16 [IU]/L (ref 0–40)
Albumin: 4.8 g/dL (ref 3.8–4.9)
Alkaline Phosphatase: 68 [IU]/L (ref 47–123)
BUN/Creatinine Ratio: 27 — ABNORMAL HIGH (ref 9–20)
BUN: 21 mg/dL (ref 6–24)
Bilirubin Total: 0.4 mg/dL (ref 0.0–1.2)
CO2: 20 mmol/L (ref 20–29)
Calcium: 9.4 mg/dL (ref 8.7–10.2)
Chloride: 102 mmol/L (ref 96–106)
Creatinine, Ser: 0.78 mg/dL (ref 0.76–1.27)
Globulin, Total: 2.1 g/dL (ref 1.5–4.5)
Glucose: 137 mg/dL — ABNORMAL HIGH (ref 70–99)
Potassium: 4.2 mmol/L (ref 3.5–5.2)
Sodium: 138 mmol/L (ref 134–144)
Total Protein: 6.9 g/dL (ref 6.0–8.5)
eGFR: 105 mL/min/{1.73_m2}

## 2024-06-05 LAB — MICROALBUMIN / CREATININE URINE RATIO
Creatinine, Urine: 66.5 mg/dL
Microalb/Creat Ratio: 5 mg/g{creat} (ref 0–29)
Microalbumin, Urine: 3.6 ug/mL

## 2024-06-05 LAB — LIPID PANEL
Chol/HDL Ratio: 4 ratio (ref 0.0–5.0)
Cholesterol, Total: 169 mg/dL (ref 100–199)
HDL: 42 mg/dL
LDL Chol Calc (NIH): 79 mg/dL (ref 0–99)
Triglycerides: 297 mg/dL — ABNORMAL HIGH (ref 0–149)
VLDL Cholesterol Cal: 48 mg/dL — ABNORMAL HIGH (ref 5–40)

## 2024-06-07 ENCOUNTER — Ambulatory Visit: Payer: Self-pay | Admitting: Family

## 2024-11-01 ENCOUNTER — Ambulatory Visit: Payer: Self-pay

## 2024-11-01 ENCOUNTER — Encounter: Admitting: Family

## 2024-11-12 ENCOUNTER — Encounter: Admitting: Family
